# Patient Record
Sex: Female | Born: 1980 | Race: Black or African American | Hispanic: No | Marital: Married | Smoking: Current every day smoker
Health system: Southern US, Community
[De-identification: ages and names within clinical notes are randomized; demographics above are authoritative.]

---

## 2005-07-06 HISTORY — PX: ECTOPIC PREGNANCY SURGERY: SHX613

## 2006-09-14 ENCOUNTER — Emergency Department: Payer: Self-pay | Admitting: Emergency Medicine

## 2011-01-08 ENCOUNTER — Ambulatory Visit: Payer: Self-pay | Admitting: Family Medicine

## 2015-01-24 ENCOUNTER — Emergency Department
Admission: EM | Admit: 2015-01-24 | Discharge: 2015-01-24 | Disposition: A | Discharge status: Home / Self Care | Payer: Self-pay | Attending: Student | Admitting: Student

## 2015-01-24 ENCOUNTER — Encounter: Payer: Self-pay | Admitting: Emergency Medicine

## 2015-01-24 DIAGNOSIS — Z720 Tobacco use: Secondary | ICD-10-CM | POA: Insufficient documentation

## 2015-01-24 DIAGNOSIS — L720 Epidermal cyst: Secondary | ICD-10-CM | POA: Insufficient documentation

## 2015-01-24 MED ORDER — NAPROXEN 500 MG PO TABS: 500.0000 mg | ORAL_TABLET | Freq: Two times a day (BID) | ORAL | Status: AC

## 2015-01-24 MED ORDER — TRAMADOL HCL 50 MG PO TABS: 50.0000 mg | ORAL_TABLET | Freq: Four times a day (QID) | ORAL | Status: DC | PRN

## 2015-01-24 MED ORDER — SULFAMETHOXAZOLE-TRIMETHOPRIM 800-160 MG PO TABS: 1.0000 | ORAL_TABLET | Freq: Two times a day (BID) | ORAL | Status: DC

## 2015-01-24 NOTE — ED Provider Notes (Signed)
[facility] Emergency Department Provider Note ____________________________________________  Time seen: Approximately 7:33 AM  I have reviewed the triage vital signs and the nursing notes.   HISTORY  Chief Complaint Abscess   HPI Sandra Blevins is a 34 y.o. female who presents to the emergency department for a painful bump on her back. She reports it has been there for many years, but has never become painful. She denies fever or nausea.  History reviewed. No pertinent past medical history.  There are no active problems to display for this patient.   Past Surgical History  Procedure Laterality Date  . Ectopic pregnancy surgery  2007    Current Outpatient Rx  Name  Route  Sig  Dispense  Refill  . naproxen (NAPROSYN) 500 MG tablet   Oral   Take 1 tablet (500 mg total) by mouth 2 (two) times daily with a meal.   60 tablet   2   . sulfamethoxazole-trimethoprim (BACTRIM DS,SEPTRA DS) 800-160 MG per tablet   Oral   Take 1 tablet by mouth 2 (two) times daily.   20 tablet   0   . traMADol (ULTRAM) 50 MG tablet   Oral   Take 1 tablet (50 mg total) by mouth every 6 (six) hours as needed.   9 tablet   0     Allergies Review of patient's allergies indicates no known allergies.  No family history on file.  Social History History  Substance Use Topics  . Smoking status: Current Every Day Smoker -- 0.50 packs/day    Types: Cigarettes  . Smokeless tobacco: Not on file  . Alcohol Use: Not on file    Review of Systems   Constitutional: No fever/chills Eyes: No visual changes. ENT: No congestion or rhinorrhea Cardiovascular: Denies chest pain. Respiratory: Denies shortness of breath. Gastrointestinal: No abdominal pain.  No nausea, no vomiting.  No diarrhea.  No constipation. Genitourinary: Negative for dysuria. Musculoskeletal: Negative for back pain. Skin: Painful bump on mid back Neurological: Negative for headaches, focal weakness or  numbness.  10-point ROS otherwise negative.  ____________________________________________   PHYSICAL EXAM:  VITAL SIGNS: ED Triage Vitals  Enc Vitals Group     BP 01/24/15 0714 117/69 mmHg     Pulse Rate 01/24/15 0714 80     Resp 01/24/15 0714 18     Temp 01/24/15 0714 98.2 F (36.8 C)     Temp Source 01/24/15 0714 Oral     SpO2 01/24/15 0714 100 %     Weight 01/24/15 0714 160 lb (72.576 kg)     Height 01/24/15 0714 5\' 5"  (1.651 m)     Head Cir --      Peak Flow --      Pain Score 01/24/15 0714 7     Pain Loc --      Pain Edu? --      Excl. in GC? --     Constitutional: Alert and oriented. Well appearing and in no acute distress. Eyes: Conjunctivae are normal. PERRL. EOMI. Head: Atraumatic. Nose: No congestion/rhinnorhea. Mouth/Throat: Mucous membranes are moist.  Oropharynx non-erythematous. No oral lesions. Neck: No stridor. Cardiovascular: Normal rate, regular rhythm.  Good peripheral circulation. Respiratory: Normal respiratory effort.  No retractions. Lungs CTAB. Gastrointestinal: Soft and nontender. No distention. No abdominal bruits.  Musculoskeletal: No lower extremity tenderness nor edema.  No joint effusions. Neurologic:  Normal speech and language. No gross focal neurologic deficits are appreciated. Speech is normal. No gait instability. Skin: <1cm epidermal  cyst noted to midback. No fluctuance. No surrounding cellulitis.  Psychiatric: Mood and affect are normal. Speech and behavior are normal.  ____________________________________________   LABS (all labs ordered are listed, but only abnormal results are displayed)  Labs Reviewed - No data to display ____________________________________________  EKG   ____________________________________________  RADIOLOGY   ____________________________________________   PROCEDURES  Procedure(s) performed: None ____________________________________________   INITIAL IMPRESSION / ASSESSMENT AND PLAN / ED  COURSE  Pertinent labs & imaging results that were available during my care of the patient were reviewed by me and considered in my medical decision making (see chart for details).  I&D not indicated. Will give Bactrim and have her return if symptoms change or worsen. ____________________________________________   FINAL CLINICAL IMPRESSION(S) / ED DIAGNOSES  Final diagnoses:  Epidermal cyst       Chinita Pester, FNP 01/24/15 6962  Gayla Doss, MD 01/24/15 1527

## 2015-01-24 NOTE — Discharge Instructions (Signed)
Epidermal Cyst An epidermal cyst is sometimes called a sebaceous cyst, epidermal inclusion cyst, or infundibular cyst. These cysts usually contain a substance that looks "pasty" or "cheesy" and may have a bad smell. This substance is a protein called keratin. Epidermal cysts are usually found on the face, neck, or trunk. They may also occur in the vaginal area or other parts of the genitalia of both men and women. Epidermal cysts are usually small, painless, slow-growing bumps or lumps that move freely under the skin. It is important not to try to pop them. This may cause an infection and lead to tenderness and swelling. CAUSES  Epidermal cysts may be caused by a deep penetrating injury to the skin or a plugged hair follicle, often associated with acne. SYMPTOMS  Epidermal cysts can become inflamed and cause:  Redness.  Tenderness.  Increased temperature of the skin over the bumps or lumps.  Grayish-white, bad smelling material that drains from the bump or lump. DIAGNOSIS  Epidermal cysts are easily diagnosed by your caregiver during an exam. Rarely, a tissue sample (biopsy) may be taken to rule out other conditions that may resemble epidermal cysts. TREATMENT   Epidermal cysts often get better and disappear on their own. They are rarely ever cancerous.  If a cyst becomes infected, it may become inflamed and tender. This may require opening and draining the cyst. Treatment with antibiotics may be necessary. When the infection is gone, the cyst may be removed with minor surgery.  Small, inflamed cysts can often be treated with antibiotics or by injecting steroid medicines.  Sometimes, epidermal cysts become large and bothersome. If this happens, surgical removal in your caregiver's office may be necessary. HOME CARE INSTRUCTIONS  Only take over-the-counter or prescription medicines as directed by your caregiver.  Take your antibiotics as directed. Finish them even if you start to feel  better. SEEK MEDICAL CARE IF:   Your cyst becomes tender, red, or swollen.  Your condition is not improving or is getting worse.  You have any other questions or concerns. MAKE SURE YOU:  Understand these instructions.  Will watch your condition.  Will get help right away if you are not doing well or get worse. Document Released: 05/23/2004 Document Revised: 09/14/2011 Document Reviewed: 12/29/2010 ExitCare Patient Information 2015 [employer]. This information is not intended to replace advice given to you by your health care provider. Make sure you discuss any questions you have with your health care provider.  

## 2015-01-24 NOTE — ED Notes (Signed)
Patient presents to the ED with a painful raised area to her back.  Patient states she has had a "bump" on her back for ten years but it has never bothered her but Wednesday it started to be painful and slightly swollen.  Patient's husband "squeezed" area and greenish/brown discharge came out per patient.  Patient states area remains very tender and painful.

## 2015-01-24 NOTE — ED Notes (Signed)
Small abcess mid back

## 2017-11-12 ENCOUNTER — Encounter: Payer: Self-pay | Admitting: Emergency Medicine

## 2017-11-12 ENCOUNTER — Emergency Department
Admission: EM | Admit: 2017-11-12 | Discharge: 2017-11-14 | Disposition: A | Discharge status: Home / Self Care | Payer: 59 | Attending: Emergency Medicine | Admitting: Emergency Medicine

## 2017-11-12 DIAGNOSIS — F329 Major depressive disorder, single episode, unspecified: Secondary | ICD-10-CM | POA: Diagnosis not present

## 2017-11-12 DIAGNOSIS — F17210 Nicotine dependence, cigarettes, uncomplicated: Secondary | ICD-10-CM | POA: Diagnosis not present

## 2017-11-12 DIAGNOSIS — F32A Depression, unspecified: Secondary | ICD-10-CM

## 2017-11-12 DIAGNOSIS — R45851 Suicidal ideations: Secondary | ICD-10-CM | POA: Diagnosis not present

## 2017-11-12 DIAGNOSIS — F99 Mental disorder, not otherwise specified: Secondary | ICD-10-CM | POA: Diagnosis present

## 2017-11-12 LAB — CBC
HCT: 36 % (ref 35.0–47.0)
Hemoglobin: 12.3 g/dL (ref 12.0–16.0)
MCH: 30.5 pg (ref 26.0–34.0)
MCHC: 34.1 g/dL (ref 32.0–36.0)
MCV: 89.4 fL (ref 80.0–100.0)
PLATELETS: 318 10*3/uL (ref 150–440)
RBC: 4.03 MIL/uL (ref 3.80–5.20)
RDW: 15 % — AB (ref 11.5–14.5)
WBC: 4.1 10*3/uL (ref 3.6–11.0)

## 2017-11-12 LAB — COMPREHENSIVE METABOLIC PANEL
ALT: 14 U/L (ref 14–54)
AST: 21 U/L (ref 15–41)
Albumin: 4.8 g/dL (ref 3.5–5.0)
Alkaline Phosphatase: 47 U/L (ref 38–126)
Anion gap: 6 (ref 5–15)
BUN: 9 mg/dL (ref 6–20)
CALCIUM: 9.4 mg/dL (ref 8.9–10.3)
CHLORIDE: 106 mmol/L (ref 101–111)
CO2: 26 mmol/L (ref 22–32)
CREATININE: 0.63 mg/dL (ref 0.44–1.00)
GFR calc Af Amer: >60 mL/min (ref >60)
GFR calc non Af Amer: >60 mL/min (ref >60)
Glucose, Bld: 99 mg/dL (ref 65–99)
Potassium: 3.8 mmol/L (ref 3.5–5.1)
Sodium: 138 mmol/L (ref 135–145)
Total Bilirubin: 1.4 mg/dL — ABNORMAL HIGH (ref 0.3–1.2)
Total Protein: 8.9 g/dL — ABNORMAL HIGH (ref 6.5–8.1)

## 2017-11-12 LAB — URINE DRUG SCREEN, QUALITATIVE (ARMC ONLY)
Amphetamines, Ur Screen: NOT DETECTED
BARBITURATES, UR SCREEN: NOT DETECTED
BENZODIAZEPINE, UR SCRN: NOT DETECTED
CANNABINOID 50 NG, UR ~~LOC~~: POSITIVE — AB
Cocaine Metabolite,Ur ~~LOC~~: NOT DETECTED
MDMA (Ecstasy)Ur Screen: NOT DETECTED
Methadone Scn, Ur: NOT DETECTED
Opiate, Ur Screen: NOT DETECTED
Phencyclidine (PCP) Ur S: NOT DETECTED
TRICYCLIC, UR SCREEN: NOT DETECTED

## 2017-11-12 LAB — SALICYLATE LEVEL

## 2017-11-12 LAB — ETHANOL

## 2017-11-12 LAB — ACETAMINOPHEN LEVEL: Acetaminophen (Tylenol), Serum: <10 ug/mL — ABNORMAL LOW (ref 10–30)

## 2017-11-12 NOTE — ED Notes (Signed)
Patient called out for RN.  RN to bedside. Patient reported the process is taking too long and would like to be discharged. RN discussed process and expected wait times. Patient continued to express desire to leave. MD informed.

## 2017-11-12 NOTE — ED Notes (Signed)
This RN and Biochemist, clinical went to patient's bedside and discussed patient's desire to leave. Patient informed that she in under IVC. RN's discussed with patient the need to speak with counselor and psychiatry. Patient verbalized understanding of all information discussed. Patient calm and cooperative at this time.

## 2017-11-12 NOTE — ED Notes (Signed)
TTS counselor at bedside. 

## 2017-11-12 NOTE — BH Assessment (Signed)
Assessment Note  Sandra Blevins is an 37 y.o. female who presents to the ED via POV under the advisement of her husband. Pt states that she was out with her husband shopping and afterwards he brought her to the ED because the pt states, "He is concerned about my mental health".  She reports that "no real event" caused her to come to the ED or triggered her emotions she just "feels useless" and has overwhelming thoughts of sadness and depression. She reports that she isn't currently suicidal but has had several thoughts of suicide in the past. She states, "I never had a plan to kill myself. I just want to go home."  Pt reports that she lives in the home with her husband and three children (17, 14, 10).   During the assessment the pt was calm, cooperative, and seemed to have a desire to talk about her feelings truthfully. She does report depression symptoms including but not limited to overwhelming sadness, weight loss, feelings of worthlessness, and random crying spells that can last anywhere from 5-15 minutes in duration. Pt reports that she uses marijuana daily as a way to self-medicate her depression symptoms. She currently denes SI/HI A/V H/D at this time.    Diagnosis: Major Depressive Disorder.   Past Medical History: History reviewed. No pertinent past medical history.  Past Surgical History:  Procedure Laterality Date  . ECTOPIC PREGNANCY SURGERY  2007    Family History: No family history on file.  Social History:  reports that she has been smoking cigarettes.  She has been smoking about 0.50 packs per day. She does not have any smokeless tobacco history on file. Her alcohol and drug histories are not on file.  Additional Social History:  Alcohol / Drug Use Pain Medications: SEE MAR Prescriptions: SEE MAR Over the Counter: SEE MAR History of alcohol / drug use?: Yes Substance #1 Name of Substance 1: Marijuana 1 - Age of First Use: 15 1 - Frequency: Daily  CIWA: CIWA-Ar BP: (!)  149/84 Pulse Rate: (!) 56 COWS:    Allergies: No Known Allergies  Home Medications:  (Not in a hospital admission)  OB/GYN Status:  Patient's last menstrual period was 11/11/2017 (exact date).  General Assessment Data Location of Assessment: [facility] ED TTS Assessment: In system Is this a Tele or Face-to-Face Assessment?: Face-to-Face Is this an Initial Assessment or a Re-assessment for this encounter?: Initial Assessment Marital status: Married Is patient pregnant?: No Pregnancy Status: No Living Arrangements: Spouse/significant other, Children Can pt return to current living arrangement?: Yes Admission Status: Involuntary Is patient capable of signing voluntary admission?: No Referral Source: Self/Family/Friend Insurance type: AETNA  Medical Screening Exam [facility] Walk-in ONLY) Medical Exam completed: Yes  Crisis Care Plan Living Arrangements: Spouse/significant other, Children Legal Guardian: Other:(SELF) Name of Psychiatrist: N/A Name of Therapist: N/A  Education Status Is patient currently in school?: No Is the patient employed, unemployed or receiving disability?: Employed  Risk to self with the past 6 months Suicidal Ideation: No-Not Currently/Within Last 6 Months Has patient been a risk to self within the past 6 months prior to admission? : Yes Suicidal Intent: No-Not Currently/Within Last 6 Months Has patient had any suicidal intent within the past 6 months prior to admission? : Yes Is patient at risk for suicide?: Yes Suicidal Plan?: No-Not Currently/Within Last 6 Months Has patient had any suicidal plan within the past 6 months prior to admission? : Yes Access to Means: Yes Specify Access to Suicidal Means: Pt has gun  inside home.  What has been your use of drugs/alcohol within the last 12 months?: Pt reports marijuana use, daily Previous Attempts/Gestures: Yes How many times?: (SEVERAL) Other Self Harm Risks: N./A Triggers for Past Attempts: Other  (Comment)(Depression) Intentional Self Injurious Behavior: None Family Suicide History: No Recent stressful life event(s): Turmoil (Comment) Persecutory voices/beliefs?: No Depression: Yes Depression Symptoms: Tearfulness, Isolating, Loss of interest in usual pleasures, Feeling worthless/self pity Substance abuse history and/or treatment for substance abuse?: No Suicide prevention information given to non-admitted patients: Not applicable  Risk to Others within the past 6 months Homicidal Ideation: No Does patient have any lifetime risk of violence toward others beyond the six months prior to admission? : No Thoughts of Harm to Others: No Current Homicidal Intent: No Current Homicidal Plan: No Access to Homicidal Means: No Identified Victim: n/a History of harm to others?: No Assessment of Violence: None Noted Violent Behavior Description: none noted Does patient have access to weapons?: No Criminal Charges Pending?: No Does patient have a court date: No Is patient on probation?: No  Psychosis Hallucinations: None noted Delusions: None noted  Mental Status Report Appearance/Hygiene: Unremarkable Eye Contact: Good Motor Activity: Freedom of movement Speech: Logical/coherent Level of Consciousness: Alert Mood: Depressed, Irritable Affect: Appropriate to circumstance, Depressed Anxiety Level: Minimal Thought Processes: Coherent, Relevant Judgement: Unimpaired Orientation: Person, Time, Place, Situation, Appropriate for developmental age Obsessive Compulsive Thoughts/Behaviors: None  Cognitive Functioning Concentration: Decreased Memory: Recent Intact, Remote Intact Is patient IDD: No Is patient DD?: No Insight: Good Impulse Control: Good Appetite: Poor Have you had any weight changes? : Loss Amount of the weight change? (lbs): 15 lbs Sleep: No Change Total Hours of Sleep: 6 Vegetative Symptoms: Staying in bed  ADLScreening [facility] Assessment Services) Patient's  cognitive ability adequate to safely complete daily activities?: Yes Patient able to express need for assistance with ADLs?: Yes Independently performs ADLs?: Yes (appropriate for developmental age)  Prior Inpatient Therapy Prior Inpatient Therapy: No  Prior Outpatient Therapy Prior Outpatient Therapy: No Does patient have an ACCT team?: No Does patient have Intensive In-House Services?  : No Does patient have [facility] services? : No Does patient have P4CC services?: No  ADL Screening (condition at time of admission) Patient's cognitive ability adequate to safely complete daily activities?: Yes Is the patient deaf or have difficulty hearing?: No Does the patient have difficulty seeing, even when wearing glasses/contacts?: No Does the patient have difficulty concentrating, remembering, or making decisions?: No Patient able to express need for assistance with ADLs?: Yes Does the patient have difficulty dressing or bathing?: No Independently performs ADLs?: Yes (appropriate for developmental age) Does the patient have difficulty walking or climbing stairs?: No Weakness of Legs: None Weakness of Arms/Hands: None  Home Assistive Devices/Equipment Home Assistive Devices/Equipment: None  Therapy Consults (therapy consults require a physician order) PT Evaluation Needed: No OT Evalulation Needed: No SLP Evaluation Needed: No Abuse/Neglect Assessment (Assessment to be complete while patient is alone) Abuse/Neglect Assessment Can Be Completed: Yes Physical Abuse: Denies Verbal Abuse: Denies Sexual Abuse: Denies Exploitation of patient/patient's resources: Denies Self-Neglect: Denies Values / Beliefs Cultural Requests During Hospitalization: None Spiritual Requests During Hospitalization: None Consults Spiritual Care Consult Needed: No Social Work Consult Needed: No      Additional Information 1:1 In Past 12 Months?: No CIRT Risk: No Elopement Risk: No Does patient have  medical clearance?: Yes  Child/Adolescent Assessment Running Away Risk: (PT IS AN ADULT)  Disposition:  Disposition Initial Assessment Completed for this Encounter: Yes Disposition  of Patient: (PENDIND RECOMMENDATION) Patient refused recommended treatment: No Mode of transportation if patient is discharged?: Car  On Site Evaluation by:   Reviewed with Physician:    Takela Varden D Claressa Hughley 11/12/2017 10:51 PM

## 2017-11-12 NOTE — ED Provider Notes (Signed)
[facility] Emergency Department Provider Note ____________________________________________   First MD Initiated Contact with Patient 11/12/17 1524     (approximate)  I have reviewed the triage vital signs and the nursing notes.   HISTORY  Chief Complaint Depression and Suicidal    HPI Sandra Blevins is a 37 y.o. female with PMH as noted below who presents with depression, gradual onset for years, worsening recently, and associated with vague suicidal ideation.  Patient states that she does not have a specific plan, but has considered crashing her car or walking into traffic.  The symptoms have not been precipitated by any specific recent stressors.  History reviewed. No pertinent past medical history.  There are no active problems to display for this patient.   Past Surgical History:  Procedure Laterality Date  . ECTOPIC PREGNANCY SURGERY  2007    Prior to Admission medications   Medication Sig Start Date End Date Taking? Authorizing Provider  sulfamethoxazole-trimethoprim (BACTRIM DS,SEPTRA DS) 800-160 MG per tablet Take 1 tablet by mouth 2 (two) times daily. 01/24/15   Triplett, Rulon Eisenmenger B, FNP  traMADol (ULTRAM) 50 MG tablet Take 1 tablet (50 mg total) by mouth every 6 (six) hours as needed. 01/24/15   Chinita Pester, FNP    Allergies Patient has no known allergies.  No family history on file.  Social History Social History   Tobacco Use  . Smoking status: Current Every Day Smoker    Packs/day: 0.50    Types: Cigarettes  Substance Use Topics  . Alcohol use: Not on file  . Drug use: Not on file    Review of Systems  Constitutional: No fever. Eyes: No redness. ENT: No neck pain. Cardiovascular: Denies chest pain. Respiratory: Denies shortness of breath. Gastrointestinal: No vomiting.  Genitourinary: Negative for flank pain.  Musculoskeletal: Negative for back pain. Skin: Negative for rash. Neurological: Negative for  headache.   ____________________________________________   PHYSICAL EXAM:  VITAL SIGNS: ED Triage Vitals  Enc Vitals Group     BP 11/12/17 1238 (!) 149/84     Pulse Rate 11/12/17 1238 (!) 56     Resp 11/12/17 1238 20     Temp 11/12/17 1238 98.3 F (36.8 C)     Temp Source 11/12/17 1238 Oral     SpO2 11/12/17 1238 100 %     Weight 11/12/17 1239 149 lb (67.6 kg)     Height 11/12/17 1239  (1.651 m)     Head Circumference --      Peak Flow --      Pain Score 11/12/17 1238 0     Pain Loc --      Pain Edu? --      Excl. in GC? --     Constitutional: Alert and oriented. Well appearing and in no acute distress. Eyes: Conjunctivae are normal.  Head: Atraumatic. Nose: No congestion/rhinnorhea. Mouth/Throat: Mucous membranes are moist.   Neck: Normal range of motion.  Cardiovascular: Good peripheral circulation. Respiratory: Normal respiratory effort.  Gastrointestinal:  No distention.  Musculoskeletal: Extremities warm and well perfused.  Neurologic:  Normal speech and language. No gross focal neurologic deficits are appreciated.  Skin:  Skin is warm and dry. No rash noted. Psychiatric: Mood and affect are normal. Speech and behavior are normal.  ____________________________________________   LABS (all labs ordered are listed, but only abnormal results are displayed)  Labs Reviewed  COMPREHENSIVE METABOLIC PANEL - Abnormal; Notable for the following components:  Result Value   Total Protein 8.9 (*)    Total Bilirubin 1.4 (*)    All other components within normal limits  ACETAMINOPHEN LEVEL - Abnormal; Notable for the following components:   Acetaminophen (Tylenol), Serum <10 (*)    All other components within normal limits  CBC - Abnormal; Notable for the following components:   RDW 15.0 (*)    All other components within normal limits  URINE DRUG SCREEN, QUALITATIVE ([facility] ONLY) - Abnormal; Notable for the following components:   Cannabinoid 50 Ng, Ur Craig  POSITIVE (*)    All other components within normal limits  ETHANOL  SALICYLATE LEVEL  POC URINE PREG, ED   ____________________________________________  EKG   ____________________________________________  RADIOLOGY    ____________________________________________   PROCEDURES  Procedure(s) performed: No  Procedures  Critical Care performed: No ____________________________________________   INITIAL IMPRESSION / ASSESSMENT AND PLAN / ED COURSE  Pertinent labs & imaging results that were available during my care of the patient were reviewed by me and considered in my medical decision making (see chart for details).  37 year old female with PMH as noted above presents with depression and suicidal ideation.  She states she has never had a formal psychiatric diagnosis and has not been on medication previously.  She does not have an active plan to kill herself.  She has no acute medical complaints.  Vital signs are normal, and the patient's exam is unremarkable.  Plan: Labs for medical clearance, psych consult, and disposition per psych recommendations.    ----------------------------------------- 11:47 PM on 11/12/2017 -----------------------------------------  Lab work-up is unremarkable.  SOC recommends admission.  ____________________________________________   FINAL CLINICAL IMPRESSION(S) / ED DIAGNOSES  Final diagnoses:  Suicidal ideation      NEW MEDICATIONS STARTED DURING THIS VISIT:  New Prescriptions   No medications on file     Note:  This document was prepared using Dragon voice recognition software and may include unintentional dictation errors.    Dionne Bucy, MD 11/12/17 2348

## 2017-11-12 NOTE — ED Triage Notes (Signed)
Pt reports here because her husband is worried about her. Pt husband reports pt has been overly depressed and distant and has voiced thoughts of harming self. Pt admits to SI.

## 2017-11-12 NOTE — ED Notes (Signed)
Pt sad, tearful, states that she is depressed. Pt states that she has been depressed for years but it has gotten worse recently and she has had thoughts of hopelessness and thoughts of hurting herself. Pt contracts for safety at this time. Pt denies recent stressor.

## 2017-11-12 NOTE — ED Notes (Signed)
Patient is speaking with the [facility]  Dr. Yehuda Budd.

## 2017-11-12 NOTE — ED Notes (Signed)

## 2017-11-13 LAB — PREGNANCY, URINE: Preg Test, Ur: NEGATIVE

## 2017-11-13 MED ORDER — DIPHENHYDRAMINE HCL 25 MG PO CAPS
25.0000 mg | ORAL_CAPSULE | Freq: Once | ORAL | Status: AC
  Administered 2017-11-13: 25 mg via ORAL
  Filled 2017-11-13: qty 1

## 2017-11-13 MED ORDER — DIPHENHYDRAMINE HCL 25 MG PO CAPS: 50.0000 mg | ORAL_CAPSULE | Freq: Every evening | ORAL | Status: DC | PRN

## 2017-11-13 NOTE — ED Provider Notes (Signed)
-----------------------------------------   2:43 PM on 11/13/2017 -----------------------------------------   Blood pressure (!) 149/84, pulse (!) 56, temperature 98.3 F (36.8 C), temperature source Oral, resp. rate 20, height  (1.651 m), weight 67.6 kg (149 lb), last menstrual period 11/11/2017, SpO2 100 %.  The patient had no acute events since last update.  Calm and cooperative at this time.  Disposition is pending Psychiatry/Behavioral Medicine team recommendations.     Minna Antis, MD 11/13/17 [phone]

## 2017-11-13 NOTE — BH Assessment (Addendum)
Per AC-Tina, patient is under review at [facility].

## 2017-11-13 NOTE — ED Notes (Signed)
Pt out to the desk for a soda and right back to her room

## 2017-11-13 NOTE — ED Notes (Signed)
ED BHU PLACEMENT JUSTIFICATION Is the patient under IVC or is there intent for IVC: Yes.   Is the patient medically cleared: Yes.   Is there vacancy in the ED BHU: Yes.   Is the population mix appropriate for patient: Yes.   Is the patient awaiting placement in inpatient or outpatient setting: Yes.   Has the patient had a psychiatric consult: Yes.   Survey of unit performed for contraband, proper placement and condition of furniture, tampering with fixtures in bathroom, shower, and each patient room: Yes.   APPEARANCE/BEHAVIOR adequate rapport can be established NEURO ASSESSMENT Orientation: time, place and person Hallucinations: No.None noted (Hallucinations) Speech: Normal Gait: normal RESPIRATORY ASSESSMENT Normal expansion.  Clear to auscultation.  No rales, rhonchi, or wheezing. CARDIOVASCULAR ASSESSMENT regular rate and rhythm, S1, S2 normal, no murmur, click, rub or gallop GASTROINTESTINAL ASSESSMENT soft, nontender, BS WNL, no r/g EXTREMITIES normal strength, tone, and muscle mass PLAN OF CARE Provide calm/safe environment. Vital signs assessed twice daily. ED BHU Assessment once each 12-hour shift. Collaborate with intake RN daily or as condition indicates. Assure the ED provider has rounded once each shift. Provide and encourage hygiene. Provide redirection as needed. Assess for escalating behavior; address immediately and inform ED provider.  Assess family dynamic and appropriateness for visitation as needed: Yes.   Educate the patient/family about BHU procedures/visitation: Yes.   

## 2017-11-13 NOTE — BH Assessment (Signed)
This Clinical research associate spoke with patient's husband Sandra Blevins in [employer] and provided him a letter for work on behalf of patient. Patient gave permission to give work letter to husband. Husband expressed frustration due to wife not being able to leave and go home. Writer explained to husband patient is IVC'd due to expressing thoughts of self harm.
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
## 2017-11-13 NOTE — BH Assessment (Addendum)
Referrals sent to the following:  . [address], [place] [address]  [phone]  . [facility]   [address] [place] [place] [address]  [phone]  . [facility]   [address] [place] [address]  [phone]  . [facility]  [address] [place] [address]  [phone]  . [facility]   [address] [place] [address]  [phone]  . [facility]  [id number] [place], [place] [address]  [phone]

## 2017-11-13 NOTE — BH Assessment (Signed)
Per Annette @ [facility] no psych beds available.    

## 2017-11-13 NOTE — ED Notes (Signed)
Pt is asleep on the floor next to her bed.

## 2017-11-14 NOTE — ED Provider Notes (Signed)
-----------------------------------------   2:10 AM on 11/14/2017 -----------------------------------------   Blood pressure (!) 149/84, pulse (!) 56, temperature 98.3 F (36.8 C), temperature source Oral, resp. rate 20, height  (1.651 m), weight 67.6 kg (149 lb), last menstrual period 11/11/2017, SpO2 100 %.  The patient had no acute events since last update.  Calm and cooperative at this time.  Disposition is pending Psychiatry/Behavioral Medicine team recommendations.     Merrily Brittle, MD 11/14/17 [phone]

## 2017-11-14 NOTE — BH Assessment (Signed)
SOC recommends patient be d/c

## 2017-11-14 NOTE — ED Notes (Signed)
Lunch meal given. 

## 2017-11-14 NOTE — ED Notes (Signed)
Patient is alert and verbal. Patient is cooperative. Patient states her husband brought her in to the hospital. Patient did not wish to engage in any further conversation with this Clinical research associate. She denies SI/HI and A/V hallucinations. Patient Provided support and encouragement. Q 15 minute checks in progress and patient remains safe on unit.

## 2017-11-14 NOTE — Discharge Instructions (Addendum)
You have been seen in the emergency department for a  psychiatric concern. You have been evaluated both medically as well as psychiatrically. Please follow-up with your outpatient resources provided. Return to the emergency department for any worsening symptoms, or any thoughts of hurting yourself or anyone else so that we may attempt to help you. 

## 2017-11-14 NOTE — ED Notes (Signed)
Patient discharged home. Follow-up/Discharge instructions reviewed with patient and written copy given. Patient verbalized understanding. Patient given suicide prevention number on discharge papers. Patient left ambulatory and alert and oriented. Patient voiced no concerns and had no questions at this time. Vitals at discharge 98.0-124/81-81-18-100% room air. Patient escorted to lobby by staff.

## 2017-11-14 NOTE — ED Provider Notes (Signed)
-----------------------------------------   12:26 PM on 11/14/2017 -----------------------------------------  Patient has been seen and evaluated by specialist on-call psychiatrist.  They believe the patient is psychiatrically stable and safe for discharge home.  They have rescinded the IVC.  Patient's medical work-up is been largely nonrevealing.   Minna Antis, MD 11/14/17 1226

## 2017-11-14 NOTE — BH Assessment (Signed)
Repeat SOC has been ordered for possible d/c.

## 2018-02-11 ENCOUNTER — Encounter: Payer: Self-pay | Admitting: Advanced Practice Midwife

## 2018-02-25 ENCOUNTER — Encounter: Payer: Self-pay | Admitting: Obstetrics and Gynecology

## 2018-03-08 ENCOUNTER — Encounter: Payer: 59 | Admitting: Obstetrics and Gynecology

## 2018-08-08 ENCOUNTER — Other Ambulatory Visit: Payer: Self-pay | Admitting: Physician Assistant

## 2018-08-08 DIAGNOSIS — N644 Mastodynia: Secondary | ICD-10-CM

## 2021-02-03 ENCOUNTER — Other Ambulatory Visit: Payer: Self-pay

## 2021-02-03 ENCOUNTER — Emergency Department
Admission: EM | Admit: 2021-02-03 | Discharge: 2021-02-03 | Disposition: A | Discharge status: Home / Self Care | Payer: 59 | Attending: Emergency Medicine | Admitting: Emergency Medicine

## 2021-02-03 DIAGNOSIS — F17210 Nicotine dependence, cigarettes, uncomplicated: Secondary | ICD-10-CM | POA: Insufficient documentation

## 2021-02-03 DIAGNOSIS — K0889 Other specified disorders of teeth and supporting structures: Secondary | ICD-10-CM | POA: Insufficient documentation

## 2021-02-03 DIAGNOSIS — Z91040 Latex allergy status: Secondary | ICD-10-CM | POA: Insufficient documentation

## 2021-02-03 MED ORDER — HYDROCODONE-ACETAMINOPHEN 5-325 MG PO TABS
1.0000 | ORAL_TABLET | Freq: Once | ORAL | Status: AC
  Administered 2021-02-03: 1 via ORAL
  Filled 2021-02-03: qty 1

## 2021-02-03 MED ORDER — PENICILLIN V POTASSIUM 500 MG PO TABS
500.0000 mg | ORAL_TABLET | Freq: Once | ORAL | Status: AC
  Administered 2021-02-03: 500 mg via ORAL
  Filled 2021-02-03: qty 1

## 2021-02-03 MED ORDER — PENICILLIN V POTASSIUM 500 MG PO TABS: 500.0000 mg | ORAL_TABLET | Freq: Four times a day (QID) | ORAL | 0 refills | Status: DC

## 2021-02-03 MED ORDER — HYDROCODONE-ACETAMINOPHEN 5-325 MG PO TABS: 1.0000 | ORAL_TABLET | ORAL | 0 refills | Status: AC | PRN

## 2021-02-03 NOTE — ED Provider Notes (Signed)
[facility] Emergency Department Provider Note  Time seen: 11:08 AM  I have reviewed the triage vital signs and the nursing notes.   HISTORY  Chief Complaint Dental Pain   HPI Sandra Blevins is a 40 y.o. female with no significant past medical history presents to the emergency department for right upper dental pain.  According to the patient since yesterday she has been experiencing pain in the right upper molar which has now become severe.  No known fever.  Largely negative review of systems otherwise.  Patient has a Education officer, community but they are not open today, states she will follow-up tomorrow.   No past medical history on file.  There are no problems to display for this patient.   Past Surgical History:  Procedure Laterality Date   ECTOPIC PREGNANCY SURGERY  2007    Prior to Admission medications   Not on File    Allergies  Allergen Reactions   Latex Itching    No family history on file.  Social History Social History   Tobacco Use   Smoking status: Every Day    Packs/day: 0.50    Types: Cigarettes    Review of Systems Constitutional: Negative for fever. ENT: Right upper molar/dental pain. Cardiovascular: Negative for chest pain. Respiratory: Negative for shortness of breath. Gastrointestinal: Negative for abdominal pain Musculoskeletal: Negative for musculoskeletal complaints Neurological: Negative for headache All other ROS negative  ____________________________________________   PHYSICAL EXAM:  VITAL SIGNS: ED Triage Vitals  Enc Vitals Group     BP 02/03/21 0958 (!) 138/103     Pulse Rate 02/03/21 0958 85     Resp 02/03/21 0958 17     Temp 02/03/21 0958 98.7 F (37.1 C)     Temp Source 02/03/21 0958 Oral     SpO2 02/03/21 0958 99 %     Weight 02/03/21 1001 130 lb (59 kg)     Height 02/03/21 1001 5\' 5"  (1.651 m)     Head Circumference --      Peak Flow --      Pain Score 02/03/21 1001 10     Pain Loc --      Pain Edu? --       Excl. in GC? --    Constitutional: Alert and oriented. Well appearing and in no distress. Eyes: Normal exam ENT      Head: Normocephalic and atraumatic.  Tenderness over the right maxillary area.  No objective swelling/edema or erythema.      Mouth/Throat: Mucous membranes are moist.  Patient has an older appearing cavity of the right upper molar.  There is no gum tenderness or sign of abscess.  No drainage.  Patient able to open her mouth adequately, no trismus. Cardiovascular: Normal rate, regular rhythm. No murmurs, rubs, or gallops. Respiratory: Normal respiratory effort without tachypnea nor retractions. Breath sounds are clear Gastrointestinal: Soft and nontender. No distention.  Musculoskeletal: Nontender with normal range of motion in all extremities.  Neurologic:  Normal speech and language. No gross focal neurologic deficits Skin:  Skin is warm, dry and intact.  Psychiatric: Mood and affect are normal.   ____________________________________________   INITIAL IMPRESSION / ASSESSMENT AND PLAN / ED COURSE  Pertinent labs & imaging results that were available during my care of the patient were reviewed by me and considered in my medical decision making (see chart for details).   Patient presents to the emergency department for right upper dental pain started yesterday per patient.  Now severe.  No obvious signs of abscess or facial cellulitis.  Patient has a dentist but they are not open today and she will call first thing tomorrow morning.  We will place the patient on a short course of pain medication as well as 10 days of penicillin.  Patient agreeable to plan of care.  Provided by normal dental return precautions.  Concerning for possible developing dental infection but no signs of abscess.  Sandra Blevins was evaluated in Emergency Department on 02/03/2021 for the symptoms described in the history of present illness. She was evaluated in the context of the global COVID-19 pandemic,  which necessitated consideration that the patient might be at risk for infection with the SARS-CoV-2 virus that causes COVID-19. Institutional protocols and algorithms that pertain to the evaluation of patients at risk for COVID-19 are in a state of rapid change based on information released by regulatory bodies including the CDC and federal and state organizations. These policies and algorithms were followed during the patient's care in the ED.  ____________________________________________   FINAL CLINICAL IMPRESSION(S) / ED DIAGNOSES  Dental pain   Minna Antis, MD 02/03/21 1110

## 2021-02-03 NOTE — Discharge Instructions (Addendum)
Please follow-up with your dentist as soon as possible for recheck/reevaluation.  Return to the emergency department for any fever, worsening pain, swelling/redness of your face, or any other symptom personally concerning to yourself.

## 2021-02-03 NOTE — ED Triage Notes (Signed)
Pt here with right side dental pain. Pt states that she woke up Sat with severe pain from her teeth and gums on the right side of her mouth. Pt in triage moaning and crying.

## 2021-02-03 NOTE — ED Notes (Signed)
See triage note  Presents with possible dental abscess/pain   Pain is mainly to right side of face  Denies any trauma or fever

## 2021-05-09 ENCOUNTER — Encounter: Payer: Self-pay | Admitting: Emergency Medicine

## 2021-05-09 ENCOUNTER — Emergency Department
Admission: EM | Admit: 2021-05-09 | Discharge: 2021-05-09 | Disposition: A | Discharge status: Home / Self Care | Payer: Self-pay | Attending: Emergency Medicine | Admitting: Emergency Medicine

## 2021-05-09 ENCOUNTER — Other Ambulatory Visit: Payer: Self-pay

## 2021-05-09 ENCOUNTER — Emergency Department: Payer: Self-pay

## 2021-05-09 DIAGNOSIS — F410 Panic disorder [episodic paroxysmal anxiety] without agoraphobia: Secondary | ICD-10-CM | POA: Insufficient documentation

## 2021-05-09 DIAGNOSIS — R079 Chest pain, unspecified: Secondary | ICD-10-CM

## 2021-05-09 DIAGNOSIS — R0789 Other chest pain: Secondary | ICD-10-CM | POA: Insufficient documentation

## 2021-05-09 DIAGNOSIS — Z5321 Procedure and treatment not carried out due to patient leaving prior to being seen by health care provider: Secondary | ICD-10-CM | POA: Insufficient documentation

## 2021-05-09 DIAGNOSIS — R0602 Shortness of breath: Secondary | ICD-10-CM | POA: Insufficient documentation

## 2021-05-09 LAB — URINALYSIS, ROUTINE W REFLEX MICROSCOPIC
Bilirubin Urine: NEGATIVE
Glucose, UA: NEGATIVE mg/dL
Hgb urine dipstick: NEGATIVE
Ketones, ur: 5 mg/dL — AB
Nitrite: NEGATIVE
Protein, ur: NEGATIVE mg/dL
Specific Gravity, Urine: 1.03 (ref 1.005–1.030)
pH: 5 (ref 5.0–8.0)

## 2021-05-09 LAB — PREGNANCY, URINE: Preg Test, Ur: NEGATIVE

## 2021-05-09 LAB — COMPREHENSIVE METABOLIC PANEL
ALT: 14 U/L (ref 0–44)
AST: 23 U/L (ref 15–41)
Albumin: 4.4 g/dL (ref 3.5–5.0)
Alkaline Phosphatase: 36 U/L — ABNORMAL LOW (ref 38–126)
Anion gap: 9 (ref 5–15)
BUN: 16 mg/dL (ref 6–20)
CO2: 20 mmol/L — ABNORMAL LOW (ref 22–32)
Calcium: 9.4 mg/dL (ref 8.9–10.3)
Chloride: 107 mmol/L (ref 98–111)
Creatinine, Ser: 0.87 mg/dL (ref 0.44–1.00)
GFR, Estimated: >60 mL/min (ref >60)
Glucose, Bld: 125 mg/dL — ABNORMAL HIGH (ref 70–99)
Potassium: 3.6 mmol/L (ref 3.5–5.1)
Sodium: 136 mmol/L (ref 135–145)
Total Bilirubin: 0.9 mg/dL (ref 0.3–1.2)
Total Protein: 8.2 g/dL — ABNORMAL HIGH (ref 6.5–8.1)

## 2021-05-09 LAB — TROPONIN I (HIGH SENSITIVITY): Troponin I (High Sensitivity): <2 ng/L (ref <18)

## 2021-05-09 LAB — CBC WITH DIFFERENTIAL/PLATELET
Abs Immature Granulocytes: 0.02 10*3/uL (ref 0.00–0.07)
Basophils Absolute: 0 10*3/uL (ref 0.0–0.1)
Basophils Relative: 1 %
Eosinophils Absolute: 0.1 10*3/uL (ref 0.0–0.5)
Eosinophils Relative: 2 %
HCT: 33.4 % — ABNORMAL LOW (ref 36.0–46.0)
Hemoglobin: 11 g/dL — ABNORMAL LOW (ref 12.0–15.0)
Immature Granulocytes: 0 %
Lymphocytes Relative: 29 %
Lymphs Abs: 1.9 10*3/uL (ref 0.7–4.0)
MCH: 29.3 pg (ref 26.0–34.0)
MCHC: 32.9 g/dL (ref 30.0–36.0)
MCV: 89.1 fL (ref 80.0–100.0)
Monocytes Absolute: 0.4 10*3/uL (ref 0.1–1.0)
Monocytes Relative: 6 %
Neutro Abs: 4.1 10*3/uL (ref 1.7–7.7)
Neutrophils Relative %: 62 %
Platelets: 387 10*3/uL (ref 150–400)
RBC: 3.75 MIL/uL — ABNORMAL LOW (ref 3.87–5.11)
RDW: 15 % (ref 11.5–15.5)
WBC: 6.6 10*3/uL (ref 4.0–10.5)
nRBC: 0 % (ref 0.0–0.2)

## 2021-05-09 NOTE — ED Triage Notes (Signed)
Pt here with c/o having a panic attack at work, states she has had one recently, then began having midsternal cp and some shob, went home to help herself "calm down," however the cp and chest "heaviness" continued. NAD. No shob at this time.

## 2021-05-09 NOTE — ED Provider Notes (Signed)
Emergency Medicine Provider Triage Evaluation Note  Sandra Blevins , a 40 y.o. female  was evaluated in triage.  Pt complains of chest pain, anxiety/panic feeling that started while at work. Symptoms started. Around 8:30 am. History of the same a few months ago, but was able to calm herself down.   Review of Systems  Positive: Anxiety, chest pain Negative: Fever, vomiting.  Physical Exam  There were no vitals taken for this visit. Gen:   Awake, no distress   Resp:  Normal effort  MSK:   Moves extremities without difficulty  Other:    Medical Decision Making  Medically screening exam initiated at 2:45 PM.  Appropriate orders placed.  Ecko Beasley was informed that the remainder of the evaluation will be completed by another provider, this initial triage assessment does not replace that evaluation, and the importance of remaining in the ED until their evaluation is complete.   Chinita Pester, FNP 05/09/21 1449    Dionne Bucy, MD 05/09/21 Silva Bandy

## 2021-08-01 DIAGNOSIS — J3489 Other specified disorders of nose and nasal sinuses: Secondary | ICD-10-CM | POA: Diagnosis not present

## 2021-08-01 DIAGNOSIS — U071 COVID-19: Secondary | ICD-10-CM | POA: Diagnosis not present

## 2021-08-01 DIAGNOSIS — Z872 Personal history of diseases of the skin and subcutaneous tissue: Secondary | ICD-10-CM | POA: Diagnosis not present

## 2021-11-05 ENCOUNTER — Encounter: Payer: Self-pay | Admitting: Nurse Practitioner

## 2021-11-05 ENCOUNTER — Ambulatory Visit (INDEPENDENT_AMBULATORY_CARE_PROVIDER_SITE_OTHER): Payer: BC Managed Care – PPO | Admitting: Nurse Practitioner

## 2021-11-05 VITALS — BP 117/79 | HR 91 | Temp 97.8°F | Ht 64.0 in | Wt 143.0 lb

## 2021-11-05 DIAGNOSIS — Z0000 Encounter for general adult medical examination without abnormal findings: Secondary | ICD-10-CM

## 2021-11-05 DIAGNOSIS — Z720 Tobacco use: Secondary | ICD-10-CM | POA: Diagnosis not present

## 2021-11-05 DIAGNOSIS — N920 Excessive and frequent menstruation with regular cycle: Secondary | ICD-10-CM

## 2021-11-05 NOTE — Patient Instructions (Signed)
You were seen today in the [facility] to establish care and for referral. Labs were collected, results will be available via MyChart or, if abnormal, you will be contacted by clinic staff. You were prescribed medications, please take as directed. Please follow up in 6 mths for reevaluation of symptoms.  ?

## 2021-11-05 NOTE — Progress Notes (Signed)
? ?[facility] ?[place], [place]  [address] ?Phone:  [phone]   Fax:  [phone] ?Subjective:  ? Patient ID: Sandra Blevins, female    DOB: 1981/05/01, 41 y.o.   MRN: [id number] ? ?Chief Complaint  ?Patient presents with  ? Establish Care  ?  Patient is here today to establish care and to discuss her menstrual cycle last from 8 days to now 10 days. Patient would like a birth control method to help with that. Patient  ?Would like a referral to an ob/gyn as well.  ? ?HPI ?Sandra Blevins 41 y.o. female  has no past medical history on file. To the [facility] to establish care and for referral to Ob/gyn. Last visit with PCP unknown.  ? ?Patient states that she has history of long menstrual cycles and requesting she be referred for hysterectomy. Has history of ectopic pregnancy and tubal ligation. States that she has attempted birth control in the past to help with cycles, but they were not effective and she has trouble remembering to take pills regularly.  ? ?Currently works at [employer] in [employer]. States that she does not have a balanced diet and does not exercise regularly. Denies any other concerns today. Denies any fatigue, chest pain, shortness of breath, HA or dizziness. Denies any blurred vision, numbness or tingling. ? ?History reviewed. No pertinent past medical history. ? ?Past Surgical History:  ?Procedure Laterality Date  ? ECTOPIC PREGNANCY SURGERY  2007  ? ? ?Family History  ?Problem Relation Age of Onset  ? Hypertension Mother   ? Cancer Father   ? Diabetes Father   ? ? ?Social History  ? ?Socioeconomic History  ? Marital status: Married  ?  Spouse name: Not on file  ? Number of children: Not on file  ? Years of education: Not on file  ? Highest education level: Not on file  ?Occupational History  ? Not on file  ?Tobacco Use  ? Smoking status: Every Day  ?  Packs/day: 0.50  ?  Types: Cigarettes  ? Smokeless tobacco: Not on file  ?Vaping Use  ? Vaping Use: Never  used  ?Substance and Sexual Activity  ? Alcohol use: Not Currently  ? Drug use: Yes  ?  Types: Marijuana  ?  Comment: occ  ? Sexual activity: Yes  ?  Birth control/protection: None  ?Other Topics Concern  ? Not on file  ?Social History Narrative  ? Not on file  ? ?Social Determinants of Health  ? ?Financial Resource Strain: Not on file  ?Food Insecurity: Not on file  ?Transportation Needs: Not on file  ?Physical Activity: Not on file  ?Stress: Not on file  ?Social Connections: Not on file  ?Intimate Partner Violence: Not on file  ? ? ?Outpatient Medications Prior to Visit  ?Medication Sig Dispense Refill  ? HYDROcodone-acetaminophen (NORCO/VICODIN) 5-325 MG tablet Take 1 tablet by mouth every 4 (four) hours as needed for moderate pain. (Patient not taking: Reported on 11/05/2021) 15 tablet 0  ? penicillin v potassium (VEETID) 500 MG tablet Take 1 tablet (500 mg total) by mouth 4 (four) times daily. (Patient not taking: Reported on 11/05/2021) 40 tablet 0  ? ?No facility-administered medications prior to visit.  ? ? ?Allergies  ?Allergen Reactions  ? Latex Itching  ? ? ?Review of Systems  ?Constitutional: Negative.  Negative for chills, fever and malaise/fatigue.  ?HENT: Negative.    ?Eyes: Negative.   ?Respiratory:  Negative for  cough and shortness of breath.   ?Cardiovascular:  Negative for chest pain, palpitations and leg swelling.  ?Gastrointestinal:  Negative for abdominal pain, blood in stool, constipation, diarrhea, nausea and vomiting.  ?Genitourinary:  Negative for dysuria, flank pain, frequency, hematuria and urgency.  ?     See HPI  ?Musculoskeletal: Negative.   ?Skin: Negative.   ?Neurological: Negative.   ?Psychiatric/Behavioral:  Negative for depression. The patient is not nervous/anxious.   ?All other systems reviewed and are negative. ? ?   ?Objective:  ?  ?Physical Exam ?Vitals reviewed.  ?Constitutional:   ?   General: She is not in acute distress. ?   Appearance: Normal appearance. She is normal  weight.  ?HENT:  ?   Head: Normocephalic.  ?   Right Ear: Tympanic membrane, ear canal and external ear normal. There is no impacted cerumen.  ?   Left Ear: Tympanic membrane, ear canal and external ear normal. There is no impacted cerumen.  ?   Nose: Nose normal. No congestion or rhinorrhea.  ?   Mouth/Throat:  ?   Mouth: Mucous membranes are moist.  ?   Pharynx: Oropharynx is clear. No oropharyngeal exudate or posterior oropharyngeal erythema.  ?Eyes:  ?   General: No scleral icterus.    ?   Right eye: No discharge.     ?   Left eye: No discharge.  ?   Extraocular Movements: Extraocular movements intact.  ?   Conjunctiva/sclera: Conjunctivae normal.  ?   Pupils: Pupils are equal, round, and reactive to light.  ?Neck:  ?   Vascular: No carotid bruit.  ?Cardiovascular:  ?   Rate and Rhythm: Normal rate and regular rhythm.  ?   Pulses: Normal pulses.  ?   Heart sounds: Normal heart sounds.  ?   Comments: No obvious peripheral edema ?Pulmonary:  ?   Effort: Pulmonary effort is normal.  ?   Breath sounds: Normal breath sounds.  ?Abdominal:  ?   General: Abdomen is flat. Bowel sounds are normal. There is no distension.  ?   Palpations: Abdomen is soft. There is no mass.  ?   Tenderness: There is no abdominal tenderness. There is no right CVA tenderness, left CVA tenderness, guarding or rebound.  ?   Hernia: No hernia is present.  ?Musculoskeletal:     ?   General: No swelling, tenderness, deformity or signs of injury. Normal range of motion.  ?   Cervical back: Normal range of motion and neck supple. No rigidity or tenderness.  ?   Right lower leg: No edema.  ?   Left lower leg: No edema.  ?Lymphadenopathy:  ?   Cervical: No cervical adenopathy.  ?Skin: ?   General: Skin is warm and dry.  ?   Capillary Refill: Capillary refill takes less than 2 seconds.  ?Neurological:  ?   General: No focal deficit present.  ?   Mental Status: She is alert and oriented to person, place, and time.  ?Psychiatric:     ?   Mood and Affect:  Mood normal.     ?   Behavior: Behavior normal.     ?   Thought Content: Thought content normal.     ?   Judgment: Judgment normal.  ? ? ?BP 117/79   Pulse 91   Temp 97.8 ?F (36.6 ?C)   Ht 5' 4"  (1.626 m)   Wt 143 lb (64.9 kg)   SpO2 100%   BMI 24.55 kg/m?  ?  Wt Readings from Last 3 Encounters:  ?11/05/21 143 lb (64.9 kg)  ?02/03/21 130 lb (59 kg)  ?11/12/17 149 lb (67.6 kg)  ? ? ? ?There is no immunization history on file for this patient. ? ?Diabetic Foot Exam - Simple   ?No data filed ?  ? ? ?No results found for: TSH ?Lab Results  ?Component Value Date  ? WBC 7.0 11/05/2021  ? HGB 12.9 11/05/2021  ? HCT 39.5 11/05/2021  ? MCV 90 11/05/2021  ? PLT 389 11/05/2021  ? ?Lab Results  ?Component Value Date  ? NA 140 11/05/2021  ? K 4.1 11/05/2021  ? CO2 22 11/05/2021  ? GLUCOSE 88 11/05/2021  ? BUN 11 11/05/2021  ? CREATININE 0.89 11/05/2021  ? BILITOT 1.0 11/05/2021  ? ALKPHOS 49 11/05/2021  ? AST 23 11/05/2021  ? ALT 16 11/05/2021  ? PROT 8.6 (H) 11/05/2021  ? ALBUMIN 5.1 (H) 11/05/2021  ? CALCIUM 10.3 (H) 11/05/2021  ? ANIONGAP 9 05/09/2021  ? EGFR 84 11/05/2021  ? ?Lab Results  ?Component Value Date  ? CHOL 188 11/05/2021  ? ?Lab Results  ?Component Value Date  ? HDL 79 11/05/2021  ? ?Lab Results  ?Component Value Date  ? LDLCALC 100 (H) 11/05/2021  ? ?Lab Results  ?Component Value Date  ? TRIG 48 11/05/2021  ? ?Lab Results  ?Component Value Date  ? CHOLHDL 2.4 11/05/2021  ? ?No results found for: HGBA1C ? ?   ?Assessment & Plan:  ? ?Problem List Items Addressed This Visit   ?None ?Visit Diagnoses   ? ? Encounter for wellness examination in adult    -  Primary  ? Relevant Orders  ? CBC with Differential/Platelet (Completed)  ? Comprehensive metabolic panel (Completed)  ? Lipid panel (Completed) ?Discussed diet and exercise options  ? Menorrhagia with regular cycle      ? Relevant Orders  ? Ambulatory referral to Obstetrics / Gynecology  ? Tobacco abuse     ?Discussed smoking cessation options   ? ?Follow up in  6 mths for reevaluation of symptoms, sooner as needed  ? ? ?I am having Ravina Canterbury maintain her penicillin v potassium and HYDROcodone-acetaminophen. ? ?No orders of the defined types were placed in this encou

## 2021-11-06 LAB — LIPID PANEL
Chol/HDL Ratio: 2.4 ratio (ref 0.0–4.4)
Cholesterol, Total: 188 mg/dL (ref 100–199)
HDL: 79 mg/dL (ref >39)
LDL Chol Calc (NIH): 100 mg/dL — ABNORMAL HIGH (ref 0–99)
Triglycerides: 48 mg/dL (ref 0–149)
VLDL Cholesterol Cal: 9 mg/dL (ref 5–40)

## 2021-11-06 LAB — CBC WITH DIFFERENTIAL/PLATELET
Basophils Absolute: 0.1 10*3/uL (ref 0.0–0.2)
Basos: 1 %
EOS (ABSOLUTE): 0.2 10*3/uL (ref 0.0–0.4)
Eos: 3 %
Hematocrit: 39.5 % (ref 34.0–46.6)
Hemoglobin: 12.9 g/dL (ref 11.1–15.9)
Immature Grans (Abs): 0 10*3/uL (ref 0.0–0.1)
Immature Granulocytes: 0 %
Lymphocytes Absolute: 2 10*3/uL (ref 0.7–3.1)
Lymphs: 29 %
MCH: 29.4 pg (ref 26.6–33.0)
MCHC: 32.7 g/dL (ref 31.5–35.7)
MCV: 90 fL (ref 79–97)
Monocytes Absolute: 0.6 10*3/uL (ref 0.1–0.9)
Monocytes: 8 %
Neutrophils Absolute: 4.2 10*3/uL (ref 1.4–7.0)
Neutrophils: 59 %
Platelets: 389 10*3/uL (ref 150–450)
RBC: 4.39 x10E6/uL (ref 3.77–5.28)
RDW: 14.1 % (ref 11.7–15.4)
WBC: 7 10*3/uL (ref 3.4–10.8)

## 2021-11-06 LAB — COMPREHENSIVE METABOLIC PANEL
ALT: 16 IU/L (ref 0–32)
AST: 23 IU/L (ref 0–40)
Albumin/Globulin Ratio: 1.5 (ref 1.2–2.2)
Albumin: 5.1 g/dL — ABNORMAL HIGH (ref 3.8–4.8)
Alkaline Phosphatase: 49 IU/L (ref 44–121)
BUN/Creatinine Ratio: 12 (ref 9–23)
BUN: 11 mg/dL (ref 6–24)
Bilirubin Total: 1 mg/dL (ref 0.0–1.2)
CO2: 22 mmol/L (ref 20–29)
Calcium: 10.3 mg/dL — ABNORMAL HIGH (ref 8.7–10.2)
Chloride: 102 mmol/L (ref 96–106)
Creatinine, Ser: 0.89 mg/dL (ref 0.57–1.00)
Globulin, Total: 3.5 g/dL (ref 1.5–4.5)
Glucose: 88 mg/dL (ref 70–99)
Potassium: 4.1 mmol/L (ref 3.5–5.2)
Sodium: 140 mmol/L (ref 134–144)
Total Protein: 8.6 g/dL — ABNORMAL HIGH (ref 6.0–8.5)
eGFR: 84 mL/min/{1.73_m2} (ref >59)

## 2021-11-07 ENCOUNTER — Ambulatory Visit: Payer: Self-pay | Admitting: Nurse Practitioner

## 2021-12-02 DIAGNOSIS — J3489 Other specified disorders of nose and nasal sinuses: Secondary | ICD-10-CM | POA: Diagnosis not present

## 2021-12-02 DIAGNOSIS — N921 Excessive and frequent menstruation with irregular cycle: Secondary | ICD-10-CM | POA: Diagnosis not present

## 2021-12-08 ENCOUNTER — Other Ambulatory Visit: Payer: Self-pay

## 2021-12-08 ENCOUNTER — Emergency Department: Payer: BC Managed Care – PPO

## 2021-12-08 ENCOUNTER — Emergency Department
Admission: EM | Admit: 2021-12-08 | Discharge: 2021-12-08 | Disposition: A | Discharge status: Home / Self Care | Payer: BC Managed Care – PPO | Attending: Emergency Medicine | Admitting: Emergency Medicine

## 2021-12-08 DIAGNOSIS — S139XXA Sprain of joints and ligaments of unspecified parts of neck, initial encounter: Secondary | ICD-10-CM

## 2021-12-08 DIAGNOSIS — S0990XA Unspecified injury of head, initial encounter: Secondary | ICD-10-CM | POA: Insufficient documentation

## 2021-12-08 DIAGNOSIS — S134XXA Sprain of ligaments of cervical spine, initial encounter: Secondary | ICD-10-CM | POA: Insufficient documentation

## 2021-12-08 MED ORDER — IBUPROFEN 200 MG PO TABS: 400.0000 mg | ORAL_TABLET | Freq: Four times a day (QID) | ORAL | 2 refills | Status: AC | PRN
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
## 2022-05-13 ENCOUNTER — Ambulatory Visit: Payer: BC Managed Care – PPO | Admitting: Nurse Practitioner

## 2022-09-23 ENCOUNTER — Emergency Department
Admission: EM | Admit: 2022-09-23 | Discharge: 2022-09-23 | Disposition: A | Discharge status: Home / Self Care | Payer: Self-pay | Attending: Emergency Medicine | Admitting: Emergency Medicine

## 2022-09-23 ENCOUNTER — Other Ambulatory Visit: Payer: Self-pay

## 2022-09-23 DIAGNOSIS — N3090 Cystitis, unspecified without hematuria: Secondary | ICD-10-CM | POA: Insufficient documentation

## 2022-09-23 DIAGNOSIS — I10 Essential (primary) hypertension: Secondary | ICD-10-CM | POA: Insufficient documentation

## 2022-09-23 LAB — URINALYSIS, ROUTINE W REFLEX MICROSCOPIC
Bacteria, UA: NONE SEEN
Bilirubin Urine: NEGATIVE
Glucose, UA: NEGATIVE mg/dL
Ketones, ur: NEGATIVE mg/dL
Leukocytes,Ua: NEGATIVE
Nitrite: NEGATIVE
Protein, ur: NEGATIVE mg/dL
Specific Gravity, Urine: 1.004 — ABNORMAL LOW (ref 1.005–1.030)
pH: 6 (ref 5.0–8.0)

## 2022-09-23 LAB — HIV ANTIBODY (ROUTINE TESTING W REFLEX): HIV Screen 4th Generation wRfx: NONREACTIVE

## 2022-09-23 LAB — WET PREP, GENITAL
Clue Cells Wet Prep HPF POC: NONE SEEN
Sperm: NONE SEEN
Trich, Wet Prep: NONE SEEN
WBC, Wet Prep HPF POC: <10 (ref <10)
Yeast Wet Prep HPF POC: NONE SEEN

## 2022-09-23 LAB — CHLAMYDIA/NGC RT PCR (ARMC ONLY)
Chlamydia Tr: NOT DETECTED
N gonorrhoeae: NOT DETECTED

## 2022-09-23 LAB — POC URINE PREG, ED: Preg Test, Ur: NEGATIVE

## 2022-09-23 MED ORDER — FLUCONAZOLE 150 MG PO TABS
150.0000 mg | ORAL_TABLET | Freq: Once | ORAL | 0 refills | Status: AC
Start: 2022-09-23 — End: 2022-09-23

## 2022-09-23 MED ORDER — NITROFURANTOIN MONOHYD MACRO 100 MG PO CAPS: 100.0000 mg | ORAL_CAPSULE | Freq: Two times a day (BID) | ORAL | 0 refills | Status: AC

## 2022-09-23 NOTE — Discharge Instructions (Addendum)
Your STI testing is was negative so we are treating for UTI.  Please take the Macrobid twice a day for the next 5 days.

## 2022-09-23 NOTE — ED Provider Notes (Addendum)
[facility] Provider Note    Event Date/Time   First MD Initiated Contact with Patient 09/23/22 1519     (approximate)   History   UTI   HPI  Sandra Blevins is a 42 y.o. female with no stated past medical history presents with concern for UTI.  She notes that over the last day she has had some pressure with urination and feels that she cannot hold her urine is having urinary urgency.  Feels like urine is cloudy.  Also has some thick white discharge.  Denies abdominal pain or pelvic pain.  Denies pain with intercourse.  Denies fevers chills or flank pain.  No recent history of UTI.  No recent STDs.  She is sexually active monogamous     No past medical history on file.  There are no problems to display for this patient.    Physical Exam  Triage Vital Signs: ED Triage Vitals  Enc Vitals Group     BP 09/23/22 1258 (!) 155/90     Pulse Rate 09/23/22 1258 76     Resp 09/23/22 1258 18     Temp 09/23/22 1258 98.8 F (37.1 C)     Temp src --      SpO2 09/23/22 1258 100 %     Weight --      Height --      Head Circumference --      Peak Flow --      Pain Score 09/23/22 1256 0     Pain Loc --      Pain Edu? --      Excl. in [place]? --     Most recent vital signs: Vitals:   09/23/22 1258  BP: (!) 155/90  Pulse: 76  Resp: 18  Temp: 98.8 F (37.1 C)  SpO2: 100%     General: Awake, no distress.  CV:  Good peripheral perfusion.  Resp:  Normal effort.  Abd:  No distention.  Abdomen is soft and nontender Neuro:             Awake, Alert, Oriented x 3  Other:  Normal external GU exam no obvious discharge at the introitus no external lesions on the vulva   ED Results / Procedures / Treatments  Labs (all labs ordered are listed, but only abnormal results are displayed) Labs Reviewed  URINALYSIS, ROUTINE W REFLEX MICROSCOPIC - Abnormal; Notable for the following components:      Result Value   Color, Urine STRAW (*)    APPearance CLEAR (*)     Specific Gravity, Urine 1.004 (*)    Hgb urine dipstick SMALL (*)    All other components within normal limits  CHLAMYDIA/NGC RT PCR ([facility] ONLY)            WET PREP, GENITAL  HIV ANTIBODY (ROUTINE TESTING W REFLEX)  POC URINE PREG, ED     EKG     RADIOLOGY    PROCEDURES:  Critical Care performed: No  Procedures   MEDICATIONS ORDERED IN ED: Medications - No data to display   IMPRESSION / MDM / [place] / ED COURSE  I reviewed the triage vital signs and the nursing notes.                              Patient's presentation is most consistent with acute complicated illness / injury requiring diagnostic workup.  Differential diagnosis includes, but is  not limited to, cystitis, cervicitis, vaginitis, less likely PID pyelonephritis  Patient is a 42 year old female presents the day of urinary urgency cloudy urine and light vaginal discharge.  Denying abdominal pain pelvic pain flank pain fevers chills nausea vomiting or other signs of systemic illness.  Patient looks quite well her vital signs are notable for hypertension but are otherwise reassuring.  Abdominal exam is benign there is no tenderness in the lower quadrants or throughout.  I did perform an external GU exam there is no obvious discharge at the introitus no lesions.  Urinalysis shows 6-10 white cells which is not the most convincing for cystitis and given her discharge I am more suspicious of a cervicitis or vaginitis.  I did collect a wet prep swab during the external GU exam.  Will send to test for trichomoniasis BV and yeast.  If negative will treat as cystitis.  Patient did not want to wait for wet prep to have results to return to I did state that I would give her a phone call to call in prescription.  Wet prep is negative.  Given patient's urinary symptoms which are the most sensitive for UTI will treat for cystitis with 5 days of Macrobid.       FINAL CLINICAL IMPRESSION(S) / ED DIAGNOSES    Final diagnoses:  Cystitis     Rx / DC Orders   ED Discharge Orders          Ordered    nitrofurantoin, macrocrystal-monohydrate, (MACROBID) 100 MG capsule  2 times daily        09/23/22 1609             Note:  This document was prepared using Dragon voice recognition software and may include unintentional dictation errors.   Rada Hay, MD 09/23/22 1551    Rada Hay, MD 09/23/22 1630

## 2022-09-23 NOTE — ED Triage Notes (Signed)
Pt comes with c/o possible UTI. Pt states this all started last Thursday. Pt states cloudy urine no burning but she feels she can't empty bladder. Pt states white discharge. Pt states she wants to be checked for STI

## 2022-09-23 NOTE — ED Notes (Signed)
3 SST, green and lav all sent to lab
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
## 2023-06-18 ENCOUNTER — Ambulatory Visit: Payer: Self-pay

## 2023-06-24 ENCOUNTER — Ambulatory Visit: Payer: Self-pay

## 2023-06-25 ENCOUNTER — Encounter: Payer: Self-pay | Admitting: Emergency Medicine

## 2023-06-25 ENCOUNTER — Other Ambulatory Visit: Payer: Self-pay

## 2023-06-25 ENCOUNTER — Emergency Department
Admission: EM | Admit: 2023-06-25 | Discharge: 2023-06-25 | Disposition: A | Discharge status: Home / Self Care | Payer: Self-pay | Attending: Emergency Medicine | Admitting: Emergency Medicine

## 2023-06-25 DIAGNOSIS — B9689 Other specified bacterial agents as the cause of diseases classified elsewhere: Secondary | ICD-10-CM | POA: Insufficient documentation

## 2023-06-25 DIAGNOSIS — B3731 Acute candidiasis of vulva and vagina: Secondary | ICD-10-CM | POA: Insufficient documentation

## 2023-06-25 DIAGNOSIS — N760 Acute vaginitis: Secondary | ICD-10-CM | POA: Insufficient documentation

## 2023-06-25 LAB — CHLAMYDIA/NGC RT PCR (ARMC ONLY)
Chlamydia Tr: NOT DETECTED
N gonorrhoeae: NOT DETECTED

## 2023-06-25 LAB — POC URINE PREG, ED: Preg Test, Ur: NEGATIVE

## 2023-06-25 LAB — URINALYSIS, ROUTINE W REFLEX MICROSCOPIC
Bilirubin Urine: NEGATIVE
Glucose, UA: NEGATIVE mg/dL
Hgb urine dipstick: NEGATIVE
Ketones, ur: NEGATIVE mg/dL
Nitrite: NEGATIVE
Protein, ur: NEGATIVE mg/dL
Specific Gravity, Urine: 1.025 (ref 1.005–1.030)
pH: 5 (ref 5.0–8.0)

## 2023-06-25 LAB — WET PREP, GENITAL
Sperm: NONE SEEN
Trich, Wet Prep: NONE SEEN
WBC, Wet Prep HPF POC: <10 (ref <10)

## 2023-06-25 MED ORDER — FLUCONAZOLE 150 MG PO TABS: ORAL_TABLET | ORAL | 0 refills | Status: DC

## 2023-06-25 MED ORDER — METRONIDAZOLE 500 MG PO TABS: 500.0000 mg | ORAL_TABLET | Freq: Two times a day (BID) | ORAL | 0 refills | Status: AC

## 2023-06-25 NOTE — ED Provider Notes (Signed)
[facility] Provider Note    Event Date/Time   First MD Initiated Contact with Patient 06/25/23 [phone]     (approximate)   History   Urinary Tract Infection   HPI  Sandra Blevins is a 42 y.o. female with no significant past medical history presents emergency department with vaginal itching that she woke up with this morning.  Is concerned it might be a STI.  States the vaginal discharge is her normal discharge.  No odor.  No fever or chills      Physical Exam   Triage Vital Signs: ED Triage Vitals  Encounter Vitals Group     BP 06/25/23 0829 138/78     Systolic BP Percentile --      Diastolic BP Percentile --      Pulse Rate 06/25/23 0829 85     Resp 06/25/23 0829 17     Temp 06/25/23 0829 98.6 F (37 C)     Temp Source 06/25/23 0829 Oral     SpO2 06/25/23 0829 99 %     Weight 06/25/23 0831 139 lb 15.9 oz (63.5 kg)     Height 06/25/23 0831 5\' 5"  (1.651 m)     Head Circumference --      Peak Flow --      Pain Score 06/25/23 0830 0     Pain Loc --      Pain Education --      Exclude from Growth Chart --     Most recent vital signs: Vitals:   06/25/23 0829  BP: 138/78  Pulse: 85  Resp: 17  Temp: 98.6 F (37 C)  SpO2: 99%     General: Awake, no distress.   CV:  Good peripheral perfusion. regular rate and  rhythm Resp:  Normal effort.  Abd:  No distention.   Other:      ED Results / Procedures / Treatments   Labs (all labs ordered are listed, but only abnormal results are displayed) Labs Reviewed  WET PREP, GENITAL - Abnormal; Notable for the following components:      Result Value   Yeast Wet Prep HPF POC PRESENT (*)    Clue Cells Wet Prep HPF POC PRESENT (*)    All other components within normal limits  URINALYSIS, ROUTINE W REFLEX MICROSCOPIC - Abnormal; Notable for the following components:   Color, Urine YELLOW (*)    APPearance CLOUDY (*)    Leukocytes,Ua MODERATE (*)    Bacteria, UA RARE (*)    All other components  within normal limits  CHLAMYDIA/NGC RT PCR ([facility] ONLY)            POC URINE PREG, ED     EKG     RADIOLOGY     PROCEDURES:   Procedures   MEDICATIONS ORDERED IN ED: Medications - No data to display   IMPRESSION / MDM / ASSESSMENT AND PLAN / ED COURSE  I reviewed the triage vital signs and the nursing notes.                              Differential diagnosis includes, but is not limited to, chlamydia, gonorrhea, bacterial vaginosis, trichomoniasis, UTI, yeast  Patient's presentation is most consistent with acute illness / injury with system symptoms.   Wet prep is positive for clue cells and WBCs  gC/chlamydia pending  UA reassuring  Did discharge patient with BV and yeast, gave her prescription  for Flagyl and Diflucan.  I explained to her I would only call her with urinalysis and GC/chlamydia findings if positive.  She can also check her results on [place] MyChart.  GC/chlamydia test negative      FINAL CLINICAL IMPRESSION(S) / ED DIAGNOSES   Final diagnoses:  BV (bacterial vaginosis)  Vaginal yeast infection     Rx / DC Orders   ED Discharge Orders          Ordered    metroNIDAZOLE (FLAGYL) 500 MG tablet  2 times daily        06/25/23 0942    fluconazole (DIFLUCAN) 150 MG tablet        06/25/23 0944             Note:  This document was prepared using Dragon voice recognition software and may include unintentional dictation errors.    Faythe Ghee, PA-C 06/25/23 1220    Sharyn Creamer, MD 06/25/23 1539

## 2023-06-25 NOTE — ED Notes (Signed)
Pt states she cannot urinate at this time while in triage. Pt instructed to give urine sample to ED staff when she is able to urinate.

## 2023-06-25 NOTE — ED Triage Notes (Signed)
Pt here suspecting a UTI. Pt states she woke up with itching. Pt denies burning with urination. Pt denies NVD or fevers. Pt would also like to have STI testing while she is here.

## 2023-08-26 ENCOUNTER — Emergency Department
Admission: EM | Admit: 2023-08-26 | Discharge: 2023-08-26 | Discharge status: Against Medical Advice | Payer: No Typology Code available for payment source | Attending: Emergency Medicine | Admitting: Emergency Medicine

## 2023-08-26 DIAGNOSIS — Z5321 Procedure and treatment not carried out due to patient leaving prior to being seen by health care provider: Secondary | ICD-10-CM | POA: Diagnosis not present

## 2023-08-26 DIAGNOSIS — K1370 Unspecified lesions of oral mucosa: Secondary | ICD-10-CM | POA: Diagnosis present

## 2023-08-26 NOTE — ED Triage Notes (Signed)
 Patient reports she has a cold sore on her lip and under her tongue. Noticed the sores this morning. No other complaints.

## 2023-09-06 ENCOUNTER — Encounter: Payer: Self-pay | Admitting: Nurse Practitioner

## 2023-09-06 ENCOUNTER — Ambulatory Visit: Payer: Self-pay | Admitting: Nurse Practitioner

## 2023-09-06 DIAGNOSIS — Z113 Encounter for screening for infections with a predominantly sexual mode of transmission: Secondary | ICD-10-CM

## 2023-09-06 LAB — HM HIV SCREENING LAB: HM HIV Screening: NEGATIVE

## 2023-09-06 LAB — HM HEPATITIS C SCREENING LAB: HM Hepatitis Screen: NEGATIVE

## 2023-09-06 LAB — WET PREP FOR TRICH, YEAST, CLUE
Trichomonas Exam: NEGATIVE
Yeast Exam: NEGATIVE

## 2023-09-06 LAB — HEPATITIS B SURFACE ANTIGEN: Hepatitis B Surface Ag: NONREACTIVE

## 2023-09-06 NOTE — Progress Notes (Unsigned)
 Pt is here for STD screening. Condoms declined. Sonda Primes, RN.

## 2023-09-06 NOTE — Progress Notes (Signed)
 [facility] Department STI clinic [address] [place] [address] Main phone: [phone]  STI screening visit  Subjective:  Sandra Blevins is a 43 y.o. female being seen today for an STI screening visit. The patient reports they do have symptoms.  Patient reports that they do not desire a pregnancy in the next year.   They reported they are not interested in discussing contraception today.    Patient's last menstrual period was 08/21/2023.  Patient has the following medical conditions:  There are no active problems to display for this patient.   Chief Complaint  Patient presents with   SEXUALLY TRANSMITTED DISEASE    Pt is here for STD screening and has symptoms   Patient is a 43 y.o. female who presents to the office today requesting symptomatic STI testing. Her reported symptoms today include increased white, thick, non-odorous vaginal discharge with urine urgency, and a bump under her tongue. Patient denies other symptoms of possible UTI including no urine frequency or dysuria.  Patient indicates 1 female partner in the last 2 months. She reports practicing vaginal, oral, and anal sex and does not use condoms. Patient indicates a history of an STI but she reports not know what and not knowing when. Patient reports last sex was 1 day ago. She indicates an ectopic pregnancy that resulted in the removal of a fallopian tube followed by ligation of the other, so patient is surgically sterile.  Patient indicates LMP was 08/21/23.    Does the patient using douching products? No  Last HIV test per patient/review of record was No results found for: "HMHIVSCREEN"  Lab Results  Component Value Date   HIV Non Reactive 09/23/2022     Last HEPC test per patient/review of record was No results found for: "HMHEPCSCREEN" No components found for: "HEPC"   Last HEPB test per patient/review of record was No components found for: "HMHEPBSCREEN"   Patient reports  last pap was:   No results found for: "DIAGPAP", "HPVHIGH", "ADEQPAP" No results found for: "SPECADGYN" No Cervical Cancer Screening results to display.  Screening for MPX risk: Does the patient have an unexplained rash? No Is the patient MSM? No Does the patient endorse multiple sex partners or anonymous sex partners? No Did the patient have close or sexual contact with a person diagnosed with MPX? No Has the patient traveled outside the [place] where MPX is endemic? No Is there a high clinical suspicion for MPX-- evidenced by one of the following No  -Unlikely to be chickenpox  -Lymphadenopathy  -Rash that present in same phase of evolution on any given body part See flowsheet for further details and programmatic requirements.   Immunization history:   There is no immunization history on file for this patient.   The following portions of the patient's history were reviewed and updated as appropriate: allergies, current medications, past medical history, past social history, past surgical history and problem list.  Objective:  There were no vitals filed for this visit.  Physical Exam Nursing note reviewed. Chaperone present: Declined chaperone.  Constitutional:      Appearance: Normal appearance.  HENT:     Head: Normocephalic.     Salivary Glands: Right salivary gland is not diffusely enlarged or tender. Left salivary gland is not diffusely enlarged or tender.     Mouth/Throat:     Lips: Pink. No lesions.     Mouth: Mucous membranes are moist.     Tongue: Lesions present. Tongue does  not deviate from midline.     Pharynx: Oropharynx is clear. Uvula midline.     Tonsils: No tonsillar exudate.     Comments: Ulcerated lesion present to underside of patient tongue. Too small to measure. Appeared clear fluid filled. Patient reported not painful.   Eyes:     General:        Right eye: No discharge.        Left eye: No discharge.  Pulmonary:     Effort: Pulmonary effort is normal.   Genitourinary:    General: Normal vulva.     Exam position: Lithotomy position.     Pubic Area: No rash or pubic lice.      Tanner stage (genital): 5.     Labia:        Right: No rash, tenderness, lesion or injury.        Left: No rash, tenderness, lesion or injury.      Vagina: Vaginal discharge present. No erythema, tenderness, bleeding or lesions.     Cervix: Normal. No cervical motion tenderness, discharge, friability, lesion, erythema, cervical bleeding or eversion.     Uterus: Normal.      Adnexa: Right adnexa normal and left adnexa normal.       Comments: pH<4.5 Minimal amount of thick, white, non-odorous d/c present within vaginal canal that is non-adherent.   Non-inflamed, not bleeding hemorrhoids present to external anal region.  Lymphadenopathy:     Head:     Right side of head: No submental, submandibular, tonsillar, preauricular or posterior auricular adenopathy.     Left side of head: No submental, submandibular, tonsillar, preauricular or posterior auricular adenopathy.     Cervical: No cervical adenopathy.     Right cervical: No superficial or posterior cervical adenopathy.    Left cervical: No superficial or posterior cervical adenopathy.     Upper Body:     Right upper body: No supraclavicular or axillary adenopathy.     Left upper body: No supraclavicular or axillary adenopathy.     Lower Body: No right inguinal adenopathy. No left inguinal adenopathy.  Skin:    General: Skin is warm and dry.     Findings: No rash.     Comments: Skin tone appropriate for ethnicity.   Neurological:     Mental Status: She is alert and oriented to person, place, and time.  Psychiatric:        Attention and Perception: Attention and perception normal.        Mood and Affect: Mood and affect normal.        Speech: Speech normal.        Behavior: Behavior normal. Behavior is cooperative.        Thought Content: Thought content normal.     Assessment and Plan:  Sandra Blevins  is a 43 y.o. female presenting to the [facility] Department for STI screening  1. Screening for venereal disease (Primary) Wet Prep negative in office today.  Encouraged patient to see PCP, UC, or ED for urinary symptoms if they continue or she begins having additional  symptoms associated with possible UTI. Patient verbalized understanding and agreed with plan.   - Chlamydia/Gonorrhea [place] [facility] - HBV Antigen/Antibody [facility] - HIV/HCV [place] [facility] - Syphilis Serology, [place] [facility] - Gonococcus culture - WET PREP FOR TRICH, YEAST, CLUE - Gonococcus culture  Patient accepted all screenings including oral, vaginal CT/GC and bloodwork for HIV/RPR, and wet prep. Patient meets criteria for HepB  screening? Yes. Ordered? yes Patient meets criteria for HepC screening? Yes. Ordered? yes  Treat wet prep per standing order Discussed time line for State Lab results and that patient will be called with positive results and encouraged patient to call if she had not heard in 2 weeks.  Counseled to return or seek care for continued or worsening symptoms Recommended repeat testing in 3 months with positive results. Recommended condom use with all sex for STI prevention.   Patient is currently using Postpartum Sterilization, Sterilization by Laparoscopy, Sterilization for Men and Women to prevent pregnancy.    Return if symptoms worsen or fail to improve.  No future appointments.  Total time with patient 30 minutes.   Edmonia James, NP

## 2023-09-07 ENCOUNTER — Encounter: Payer: Self-pay | Admitting: Nurse Practitioner

## 2023-09-11 LAB — GONOCOCCUS CULTURE

## 2024-08-02 ENCOUNTER — Encounter: Payer: Self-pay | Admitting: Certified Nurse Midwife

## 2024-08-03 ENCOUNTER — Ambulatory Visit: Admitting: Certified Nurse Midwife

## 2024-08-03 ENCOUNTER — Encounter: Payer: Self-pay | Admitting: Certified Nurse Midwife

## 2024-08-03 ENCOUNTER — Other Ambulatory Visit (HOSPITAL_COMMUNITY)
Admission: RE | Admit: 2024-08-03 | Discharge: 2024-08-03 | Disposition: A | Source: Ambulatory Visit | Attending: Certified Nurse Midwife | Admitting: Certified Nurse Midwife

## 2024-08-03 VITALS — BP 129/86 | HR 64 | Ht 65.0 in | Wt 155.6 lb

## 2024-08-03 DIAGNOSIS — Z3042 Encounter for surveillance of injectable contraceptive: Secondary | ICD-10-CM

## 2024-08-03 DIAGNOSIS — Z1329 Encounter for screening for other suspected endocrine disorder: Secondary | ICD-10-CM

## 2024-08-03 DIAGNOSIS — Z124 Encounter for screening for malignant neoplasm of cervix: Secondary | ICD-10-CM

## 2024-08-03 DIAGNOSIS — Z1331 Encounter for screening for depression: Secondary | ICD-10-CM

## 2024-08-03 DIAGNOSIS — Z01419 Encounter for gynecological examination (general) (routine) without abnormal findings: Secondary | ICD-10-CM | POA: Diagnosis present

## 2024-08-03 DIAGNOSIS — Z131 Encounter for screening for diabetes mellitus: Secondary | ICD-10-CM

## 2024-08-03 DIAGNOSIS — F17200 Nicotine dependence, unspecified, uncomplicated: Secondary | ICD-10-CM

## 2024-08-03 DIAGNOSIS — Z1151 Encounter for screening for human papillomavirus (HPV): Secondary | ICD-10-CM

## 2024-08-03 DIAGNOSIS — N926 Irregular menstruation, unspecified: Secondary | ICD-10-CM

## 2024-08-03 DIAGNOSIS — Z113 Encounter for screening for infections with a predominantly sexual mode of transmission: Secondary | ICD-10-CM | POA: Diagnosis present

## 2024-08-03 DIAGNOSIS — Z1231 Encounter for screening mammogram for malignant neoplasm of breast: Secondary | ICD-10-CM

## 2024-08-03 DIAGNOSIS — Z87891 Personal history of nicotine dependence: Secondary | ICD-10-CM

## 2024-08-03 DIAGNOSIS — Z114 Encounter for screening for human immunodeficiency virus [HIV]: Secondary | ICD-10-CM

## 2024-08-03 MED ORDER — MEDROXYPROGESTERONE ACETATE 150 MG/ML IM SUSP
150.0000 mg | Freq: Once | INTRAMUSCULAR | Status: AC
  Administered 2024-08-03: 150 mg via INTRAMUSCULAR

## 2024-08-03 NOTE — Patient Instructions (Addendum)
 Health Maintenance, Female Adopting a healthy lifestyle and getting preventive care are important in promoting health and wellness. Ask your health care provider about: The right schedule for you to have regular tests and exams. Things you can do on your own to prevent diseases and keep yourself healthy. What should I know about diet, weight, and exercise? Eat a healthy diet  Eat a diet that includes plenty of vegetables, fruits, low-fat dairy products, and lean protein. Do not eat a lot of foods that are high in solid fats, added sugars, or sodium. Maintain a healthy weight Body mass index (BMI) is used to identify weight problems. It estimates body fat based on height and weight. Your health care provider can help determine your BMI and help you achieve or maintain a healthy weight. Get regular exercise Get regular exercise. This is one of the most important things you can do for your health. Most adults should: Exercise for at least 150 minutes each week. The exercise should increase your heart rate and make you sweat (moderate-intensity exercise). Do strengthening exercises at least twice a week. This is in addition to the moderate-intensity exercise. Spend less time sitting. Even light physical activity can be beneficial. Watch cholesterol and blood lipids Have your blood tested for lipids and cholesterol at 44 years of age, then have this test every 5 years. Have your cholesterol levels checked more often if: Your lipid or cholesterol levels are high. You are older than 44 years of age. You are at high risk for heart disease. What should I know about cancer screening? Depending on your health history and family history, you may need to have cancer screening at various ages. This may include screening for: Breast cancer. Cervical cancer. Colorectal cancer. Skin cancer. Lung cancer. What should I know about heart disease, diabetes, and high blood pressure? Blood pressure and heart  disease High blood pressure causes heart disease and increases the risk of stroke. This is more likely to develop in people who have high blood pressure readings or are overweight. Have your blood pressure checked: Every 3-5 years if you are 52-29 years of age. Every year if you are 25 years old or older. Diabetes Have regular diabetes screenings. This checks your fasting blood sugar level. Have the screening done: Once every three years after age 56 if you are at a normal weight and have a low risk for diabetes. More often and at a younger age if you are overweight or have a high risk for diabetes. What should I know about preventing infection? Hepatitis B If you have a higher risk for hepatitis B, you should be screened for this virus. Talk with your health care provider to find out if you are at risk for hepatitis B infection. Hepatitis C Testing is recommended for: Everyone born from [birth year] through 1965. Anyone with known risk factors for hepatitis C. Sexually transmitted infections (STIs) Get screened for STIs, including gonorrhea and chlamydia, if: You are sexually active and are younger than 44 years of age. You are older than 44 years of age and your health care provider tells you that you are at risk for this type of infection. Your sexual activity has changed since you were last screened, and you are at increased risk for chlamydia or gonorrhea. Ask your health care provider if you are at risk. Ask your health care provider about whether you are at high risk for HIV. Your health care provider may recommend a prescription medicine to help prevent HIV  infection. If you choose to take medicine to prevent HIV, you should first get tested for HIV. You should then be tested every 3 months for as long as you are taking the medicine. Pregnancy If you are about to stop having your period (premenopausal) and you may become pregnant, seek counseling before you get pregnant. Take 400 to 800  micrograms (mcg) of folic acid every day if you become pregnant. Ask for birth control (contraception) if you want to prevent pregnancy. Osteoporosis and menopause Osteoporosis is a disease in which the bones lose minerals and strength with aging. This can result in bone fractures. If you are 64 years old or older, or if you are at risk for osteoporosis and fractures, ask your health care provider if you should: Be screened for bone loss. Take a calcium or vitamin D  supplement to lower your risk of fractures. Be given hormone replacement therapy (HRT) to treat symptoms of menopause. Follow these instructions at home: Alcohol use Do not drink alcohol if: Your health care provider tells you not to drink. You are pregnant, may be pregnant, or are planning to become pregnant. If you drink alcohol: Limit how much you have to: 0-1 drink a day. Know how much alcohol is in your drink. In the [place] one drink equals one 12 oz bottle of beer (355 mL), one 5 oz glass of wine (148 mL), or one 1 oz glass of hard liquor (44 mL). Lifestyle Do not use any products that contain nicotine  or tobacco. These products include cigarettes, chewing tobacco, and vaping devices, such as e-cigarettes. If you need help quitting, ask your health care provider. Do not use street drugs. Do not share needles. Ask your health care provider for help if you need support or information about quitting drugs. General instructions Schedule regular health, dental, and eye exams. Stay current with your vaccines. Tell your health care provider if: You often feel depressed. You have ever been abused or do not feel safe at home. Summary Adopting a healthy lifestyle and getting preventive care are important in promoting health and wellness. Follow your health care provider's instructions about healthy diet, exercising, and getting tested or screened for diseases. Follow your health care provider's instructions on monitoring your  cholesterol and blood pressure. This information is not intended to replace advice given to you by your health care provider. Make sure you discuss any questions you have with your health care provider. Document Revised: 11/11/2020 Document Reviewed: 11/11/2020 Elsevier Patient Education  2024 Elsevier Inc. Insert smoking cessation patient instructions here.

## 2024-08-03 NOTE — Progress Notes (Signed)
 "   Passmore, Christain FERNS, NP (Inactive)   Chief Complaint  Patient presents with   Establish Care   Annual Exam    HPI:      Sandra Blevins is a 44 y.o. [phone] whose LMP was Patient's last menstrual period was 07/10/2024 (exact date)., presents today for annual exam, establishment of care. She reports multiple concerns: irregular & heavy menstrual bleeding with mood changes, denies suicidality but reports she does not have anything she looks forward to. Reports her mind is never quiet. Also memory loss, brain fog. Tried Lexapro in the past but had no improvement, has been hospitalized for psychiatric illness once but unsure of date.  She denies history of mania, does endorse history of impulsivity as well as one psychiatric hospitalization. Reports a procedure on her cervix, unsure what burning or freezing when she was in [place] . Smoking 1/2ppd, would like to cut down/stop MJ daily to help with anxiety No regular exercise.  Lives with her son Safe in relationships Works full time at [employer]  Period Duration (Days): 8 Period Pattern: (!) Irregular Menstrual Flow: Heavy Menstrual Control: Thin pad, Maxi pad Menstrual Control Change Freq (Hours): 1.5-3 Dysmenorrhea: (!) Severe (The first 3 days) Dysmenorrhea Symptoms: Diarrhea, Cramping  There are no active problems to display for this patient.   Past Surgical History:  Procedure Laterality Date   ECTOPIC PREGNANCY SURGERY  2007    Family History  Problem Relation Age of Onset   Hypertension Mother    Alzheimer's disease Mother    Diabetes Father     Social History   Socioeconomic History   Marital status: Married    Spouse name: Not on file   Number of children: 1   Years of education: Not on file   Highest education level: High school graduate  Occupational History   Not on file  Tobacco Use   Smoking status: Every Day    Current packs/day: 0.50    Types: Cigarettes   Smokeless tobacco: Not on file   Vaping Use   Vaping status: Never Used  Substance and Sexual Activity   Alcohol use: Not Currently   Drug use: Yes    Types: Marijuana    Comment: occ   Sexual activity: Yes    Partners: Male    Birth control/protection: Surgical    Comment: Patient with hx of ectopic pregnancy with fallopian tube removal. Other fallopian tube ligated.  Other Topics Concern   Not on file  Social History Narrative   Not on file   Social Drivers of Health   Tobacco Use: High Risk (08/03/2024)   Patient History    Smoking Tobacco Use: Every Day    Smokeless Tobacco Use: Unknown    Passive Exposure: Not on file  Financial Resource Strain: Not on file  Food Insecurity: Not on file  Transportation Needs: Not on file  Physical Activity: Not on file  Stress: Not on file  Social Connections: Not on file  Intimate Partner Violence: Not on file  Depression (PHQ2-9): High Risk (08/03/2024)   Depression (PHQ2-9)    PHQ-2 Score: 20  Alcohol Screen: Not on file  Housing: Not on file  Utilities: Not on file  Health Literacy: Not on file    Outpatient Medications Prior to Visit  Medication Sig Dispense Refill   fluconazole  (DIFLUCAN ) 150 MG tablet Take one now and one in a week (Patient not taking: Reported on 08/03/2024) 2 tablet 0   No facility-administered medications prior to visit.  ROS:  Review of Systems  Constitutional:  Positive for activity change and fatigue.  Respiratory: Negative.    Cardiovascular: Negative.   Genitourinary:  Positive for menstrual problem.  Psychiatric/Behavioral:  Positive for dysphoric mood. The patient is nervous/anxious.      OBJECTIVE:   Vitals:  BP 129/86   Pulse 64   Ht 5' 5 (1.651 m)   Wt 155 lb 9.6 oz (70.6 kg)   LMP 07/10/2024 (Exact Date)   BMI 25.89 kg/m   Physical Exam Vitals reviewed.  Constitutional:      General: She is not in acute distress.    Appearance: Normal appearance. She is well-developed and well-groomed. She is not  ill-appearing.  HENT:     Head: Normocephalic.  Neck:     Thyroid: No thyroid mass or thyromegaly.  Cardiovascular:     Rate and Rhythm: Normal rate and regular rhythm.  Pulmonary:     Effort: Pulmonary effort is normal.     Breath sounds: Normal breath sounds.  Chest:  Breasts:    Tanner Score is 5.     Right: No inverted nipple, mass or tenderness.     Left: No inverted nipple, mass or tenderness.  Abdominal:     General: Abdomen is flat.     Palpations: Abdomen is soft.     Tenderness: There is no abdominal tenderness.  Genitourinary:    General: Normal vulva.     Vagina: Normal.     Cervix: Friability present. No cervical motion tenderness.     Uterus: Not enlarged and not tender.      Adnexa:        Right: No mass or tenderness.         Left: No mass or tenderness.       Rectum: External hemorrhoid present.  Musculoskeletal:     Cervical back: No tenderness.  Lymphadenopathy:     Upper Body:     Right upper body: No supraclavicular or axillary adenopathy.     Left upper body: No supraclavicular or axillary adenopathy.  Skin:    General: Skin is warm and dry.  Neurological:     General: No focal deficit present.     Mental Status: She is alert and oriented to person, place, and time.  Psychiatric:        Attention and Perception: Attention and perception normal.        Mood and Affect: Mood is anxious. Affect is tearful.        Speech: Speech normal.        Behavior: Behavior normal. Behavior is cooperative.        Thought Content: Thought content does not include suicidal ideation. Thought content does not include suicidal plan.     Results: No results found for this or any previous visit (from the past 24 hours).   Assessment/Plan: 1. Well woman exam (Primary) - Cytology - PAP - MM 3D SCREENING MAMMOGRAM BILATERAL BREAST; Future - CBC With Diff/Platelet - Lipid panel - Hemoglobin A1c - TSH Rfx on Abnormal to Free T4 - Ferritin - RPR W/RFLX TO RPR  TITER, TREPONEMAL AB, SCREEN AND DIAGNOSIS - HIV Antibody (routine testing w rflx) - Hepatitis C antibody - Hepatitis B surface antigen - VITAMIN D  25 Hydroxy (Vit-D Deficiency, Fractures) - Comprehensive metabolic panel with GFR - Cervicovaginal ancillary only  2. Encounter for screening for human papillomavirus (HPV) - Cytology - PAP  3. Cervical cancer screening - Cytology - PAP  4. Breast cancer  screening by mammogram - MM 3D SCREENING MAMMOGRAM BILATERAL BREAST; Future  5. Encounter for management and injection of depo-Provera  - medroxyPROGESTERone  (DEPO-PROVERA ) injection 150 mg  6. Screening for diabetes mellitus - Hemoglobin A1c  7. Screening for thyroid disorder - TSH Rfx on Abnormal to Free T4  8. Screen for sexually transmitted diseases - RPR W/RFLX TO RPR TITER, TREPONEMAL AB, SCREEN AND DIAGNOSIS - Hepatitis C antibody - Hepatitis B surface antigen - Cervicovaginal ancillary only  9. Screening for human immunodeficiency virus - HIV Antibody (routine testing w rflx)  10. Positive depression screening - Ambulatory referral to Behavioral Health  11. Personal history of tobacco use, presenting hazards to health  12. Tobacco dependency - nicotine  polacrilex (NICORETTE ) 2 MG gum; Take 1 each (2 mg total) by mouth as needed for smoking cessation.  Dispense: 110 each; Refill: 1  Discussed with [facility] uncontrolled anxiety may contribute to some of her symptoms, however given her report of not tolerating lexapro in the past and possibility of manic type symptoms. Referral to Behavioral health to start counseling & possible medication management Depo today for menorrhagia, reviewed this may take 3-4m for regulation. Smoking cessation: nicotine  gum prescribed  Follow up in one month for bleeding. Labs pending.    No orders of the defined types were placed in this encounter.    Harlene LITTIE Cisco, CNM 08/03/2024 10:06 AM       "

## 2024-08-04 ENCOUNTER — Encounter: Payer: Self-pay | Admitting: Certified Nurse Midwife

## 2024-08-04 DIAGNOSIS — Z1331 Encounter for screening for depression: Secondary | ICD-10-CM | POA: Insufficient documentation

## 2024-08-04 DIAGNOSIS — N926 Irregular menstruation, unspecified: Secondary | ICD-10-CM | POA: Insufficient documentation

## 2024-08-04 LAB — CBC WITH DIFF/PLATELET
Basophils Absolute: 0.1 10*3/uL (ref 0.0–0.2)
Basos: 1 %
EOS (ABSOLUTE): 0.1 10*3/uL (ref 0.0–0.4)
Eos: 2 %
Hematocrit: 36.5 % (ref 34.0–46.6)
Hemoglobin: 11.1 g/dL (ref 11.1–15.9)
Immature Grans (Abs): 0 10*3/uL (ref 0.0–0.1)
Immature Granulocytes: 0 %
Lymphocytes Absolute: 1.3 10*3/uL (ref 0.7–3.1)
Lymphs: 20 %
MCH: 26.2 pg — ABNORMAL LOW (ref 26.6–33.0)
MCHC: 30.4 g/dL — ABNORMAL LOW (ref 31.5–35.7)
MCV: 86 fL (ref 79–97)
Monocytes Absolute: 0.4 10*3/uL (ref 0.1–0.9)
Monocytes: 6 %
Neutrophils Absolute: 4.6 10*3/uL (ref 1.4–7.0)
Neutrophils: 71 %
Platelets: 388 10*3/uL (ref 150–450)
RBC: 4.24 x10E6/uL (ref 3.77–5.28)
RDW: 16.8 % — ABNORMAL HIGH (ref 11.7–15.4)
WBC: 6.4 10*3/uL (ref 3.4–10.8)

## 2024-08-04 LAB — SYPHILIS: RPR W/REFLEX TO RPR TITER AND TREPONEMAL ANTIBODIES, TRADITIONAL SCREENING AND DIAGNOSIS ALGORITHM: RPR Ser Ql: NONREACTIVE

## 2024-08-04 LAB — CERVICOVAGINAL ANCILLARY ONLY
Chlamydia: NEGATIVE
Comment: NEGATIVE
Comment: NEGATIVE
Comment: NORMAL
Neisseria Gonorrhea: NEGATIVE
Trichomonas: NEGATIVE

## 2024-08-04 LAB — HEPATITIS B SURFACE ANTIGEN: Hepatitis B Surface Ag: NEGATIVE

## 2024-08-04 LAB — VITAMIN D 25 HYDROXY (VIT D DEFICIENCY, FRACTURES): Vit D, 25-Hydroxy: 8.7 ng/mL — ABNORMAL LOW (ref 30.0–100.0)

## 2024-08-04 LAB — HEMOGLOBIN A1C
Est. average glucose Bld gHb Est-mCnc: 117 mg/dL
Hgb A1c MFr Bld: 5.7 % — ABNORMAL HIGH (ref 4.8–5.6)

## 2024-08-04 LAB — LIPID PANEL
Chol/HDL Ratio: 2.8 ratio (ref 0.0–4.4)
Cholesterol, Total: 191 mg/dL (ref 100–199)
HDL: 68 mg/dL
LDL Chol Calc (NIH): 114 mg/dL — ABNORMAL HIGH (ref 0–99)
Triglycerides: 44 mg/dL (ref 0–149)
VLDL Cholesterol Cal: 9 mg/dL (ref 5–40)

## 2024-08-04 LAB — COMPREHENSIVE METABOLIC PANEL WITH GFR
ALT: 12 [IU]/L (ref 0–32)
AST: 17 [IU]/L (ref 0–40)
Albumin: 4.8 g/dL (ref 3.9–4.9)
Alkaline Phosphatase: 50 [IU]/L (ref 41–116)
BUN/Creatinine Ratio: 18 (ref 9–23)
BUN: 14 mg/dL (ref 6–24)
Bilirubin Total: 0.4 mg/dL (ref 0.0–1.2)
CO2: 18 mmol/L — ABNORMAL LOW (ref 20–29)
Calcium: 9.5 mg/dL (ref 8.7–10.2)
Chloride: 108 mmol/L — ABNORMAL HIGH (ref 96–106)
Creatinine, Ser: 0.79 mg/dL (ref 0.57–1.00)
Globulin, Total: 3.1 g/dL (ref 1.5–4.5)
Glucose: 95 mg/dL (ref 70–99)
Potassium: 4.6 mmol/L (ref 3.5–5.2)
Sodium: 143 mmol/L (ref 134–144)
Total Protein: 7.9 g/dL (ref 6.0–8.5)
eGFR: 95 mL/min/{1.73_m2}

## 2024-08-04 LAB — TSH RFX ON ABNORMAL TO FREE T4: TSH: 2.13 u[IU]/mL (ref 0.450–4.500)

## 2024-08-04 LAB — FERRITIN: Ferritin: 9 ng/mL — ABNORMAL LOW (ref 15–150)

## 2024-08-04 LAB — HIV ANTIBODY (ROUTINE TESTING W REFLEX): HIV Screen 4th Generation wRfx: NONREACTIVE

## 2024-08-04 LAB — HEPATITIS C ANTIBODY: Hep C Virus Ab: NONREACTIVE

## 2024-08-04 MED ORDER — NICOTINE POLACRILEX 2 MG MT GUM: 2.0000 mg | CHEWING_GUM | OROMUCOSAL | 1 refills | Status: AC | PRN

## 2024-08-05 ENCOUNTER — Other Ambulatory Visit: Payer: Self-pay | Admitting: Certified Nurse Midwife

## 2024-08-05 ENCOUNTER — Ambulatory Visit: Payer: Self-pay | Admitting: Certified Nurse Midwife

## 2024-08-05 DIAGNOSIS — R7303 Prediabetes: Secondary | ICD-10-CM

## 2024-08-05 MED ORDER — FERROUS SULFATE 325 (65 FE) MG PO TABS: 325.0000 mg | ORAL_TABLET | ORAL | 3 refills | Status: AC

## 2024-08-05 MED ORDER — VITAMIN D (ERGOCALCIFEROL) 1.25 MG (50000 UNIT) PO CAPS: 50000.0000 [IU] | ORAL_CAPSULE | ORAL | 0 refills | Status: AC

## 2024-08-07 ENCOUNTER — Telehealth: Admitting: Family Medicine

## 2024-08-07 DIAGNOSIS — B379 Candidiasis, unspecified: Secondary | ICD-10-CM | POA: Diagnosis not present

## 2024-08-07 DIAGNOSIS — R3989 Other symptoms and signs involving the genitourinary system: Secondary | ICD-10-CM

## 2024-08-07 DIAGNOSIS — T3695XA Adverse effect of unspecified systemic antibiotic, initial encounter: Secondary | ICD-10-CM | POA: Diagnosis not present

## 2024-08-07 DIAGNOSIS — L0100 Impetigo, unspecified: Secondary | ICD-10-CM | POA: Diagnosis not present

## 2024-08-07 MED ORDER — MUPIROCIN 2 % EX OINT: 1.0000 | TOPICAL_OINTMENT | Freq: Two times a day (BID) | CUTANEOUS | 0 refills | Status: AC

## 2024-08-07 MED ORDER — NITROFURANTOIN MONOHYD MACRO 100 MG PO CAPS: 100.0000 mg | ORAL_CAPSULE | Freq: Two times a day (BID) | ORAL | 0 refills | Status: AC

## 2024-08-07 MED ORDER — FLUCONAZOLE 150 MG PO TABS: 150.0000 mg | ORAL_TABLET | ORAL | 0 refills | Status: AC

## 2024-08-07 NOTE — Patient Instructions (Addendum)
 " Sandra Blevins, thank you for joining Chiquita CHRISTELLA Barefoot, NP for today's virtual visit.  While this provider is not your primary care provider (PCP), if your PCP is located in our provider database this encounter information will be shared with them immediately following your visit.   A [facility] MyChart account gives you access to today's visit and all your visits, tests, and labs performed at [facility]  click here if you don't have a [facility] MyChart account or go to [email or web address]  Consent: (Patient) Sandra Blevins provided verbal consent for this virtual visit at the beginning of the encounter.  Current Medications:  Current Outpatient Medications:    fluconazole  (DIFLUCAN ) 150 MG tablet, Take 1 tablet (150 mg total) by mouth as directed. Repeat in 3 days as needed, Disp: 2 tablet, Rfl: 0   mupirocin  ointment (BACTROBAN ) 2 %, Apply 1 Application topically 2 (two) times daily., Disp: 22 g, Rfl: 0   nitrofurantoin , macrocrystal-monohydrate, (MACROBID ) 100 MG capsule, Take 1 capsule (100 mg total) by mouth 2 (two) times daily for 5 days., Disp: 10 capsule, Rfl: 0   ferrous sulfate  325 (65 FE) MG tablet, Take 1 tablet (325 mg total) by mouth every other day., Disp: 60 tablet, Rfl: 3   nicotine  polacrilex (NICORETTE ) 2 MG gum, Take 1 each (2 mg total) by mouth as needed for smoking cessation., Disp: 110 each, Rfl: 1   Vitamin D , Ergocalciferol , (DRISDOL ) 1.25 MG (50000 UNIT) CAPS capsule, Take 1 capsule (50,000 Units total) by mouth every 7 (seven) days for 6 doses., Disp: 6 capsule, Rfl: 0   Medications ordered in this encounter:  Meds ordered this encounter  Medications   mupirocin  ointment (BACTROBAN ) 2 %    Sig: Apply 1 Application topically 2 (two) times daily.    Dispense:  22 g    Refill:  0    Supervising Provider:   BLAISE ALEENE KIDD [[id number]]   nitrofurantoin , macrocrystal-monohydrate, (MACROBID ) 100 MG capsule    Sig: Take 1 capsule (100 mg total) by mouth 2  (two) times daily for 5 days.    Dispense:  10 capsule    Refill:  0    Supervising Provider:   BLAISE ALEENE KIDD [[id number]]   fluconazole  (DIFLUCAN ) 150 MG tablet    Sig: Take 1 tablet (150 mg total) by mouth as directed. Repeat in 3 days as needed    Dispense:  2 tablet    Refill:  0    Supervising Provider:   LAMPTEY, PHILIP O [[id number]]     *If you need refills on other medications prior to your next appointment, please contact your pharmacy*  Follow-Up: Call back or seek an in-person evaluation if the symptoms worsen or if the condition fails to improve as anticipated.     Other Instructions Skin Infection (Impetigo) in Adults: What to Know Impetigo is an infection of the skin. It commonly occurs in young children, but it can also occur in adults. There are two types of impetigo: Bullous. Nonbullous. Ecthyma is a skin problem that is like impetigo but more serious. It is usually caused by the same bacteria. Ecthyma causes deep sores on the skin that can leave scars. Sometimes, people call it deep impetigo. Impetigo usually goes away in 7-10 days with treatment. What are the causes? This condition is caused by two types of germs (bacteria). Nonbullous impetigo can be caused by staphylococci or streptococci. Bullous impetigo is caused by staphylococci. These bacteria cause impetigo when they get under the  surface of the skin. Impetigo is contagious, which means it spreads easily from person to person. It may be spread through close skin contact or by sharing towels, clothing, or other items that an infected person has touched. Scratching the affected area can cause impetigo to spread to other parts of the body. The bacteria can get under your fingernails and spread when you touch another area of your skin. What increases the risk? The following factors may make you more likely to get this condition: Playing sports that include skin-to-skin contact with others. Having broken skin,  such as from a cut, scrape, insect bite, or rash. Living in an area that has high humidity levels. Having poor hygiene. Having high levels of staphylococci in your nose. Having a condition that weakens the skin, such as: Having a weak body defense system (immune system). Having a skin condition with open sores, such as chickenpox. Having diabetes. What are the signs or symptoms? Impetigo causes itchy blisters and sores. Your symptoms will depend on the type of impetigo you have. In some cases, the blisters can cause itching or burning. Bullous impetigo You might get big blisters that break open and leak yellow fluid. These blisters usually appear on your belly, arms, legs, under your arms, or in your groin area. Nonbullous impetigo You might get small blisters that break open and leak, making a yellow crust. These blisters are often found on your face, arms, or legs. Ecthyma You might have deep sores that break open and leak, forming a black or brown crust. How is this diagnosed? Impetigo may be diagnosed with an exam. Your health care provider will look at the sores. In some cases, a swab of the fluid from the sores may be taken. How is this treated? Treatment for this condition depends on how bad your symptoms are Mild impetigo can be treated with prescription antibiotic cream. Oral antibiotic medicine may be used in really bad cases. Medicines that help with itching (antihistamines)may also be used. Follow these instructions at home: Medicines Take medicines only as told. If you were given antibiotic cream or ointment, use it for the time you were told. Do not stop using it sooner even if you start to feel better. Before applying antibiotic cream or ointment, you should: Gently wash the infected areas with antibacterial soap and warm water. Soak crusted areas in warm, soapy water using antibacterial soap. Gently rub the areas to remove crusts. Do not scrub. Preventing the spread  of infection  To help prevent impetigo from spreading to other body areas: Keep your fingernails short and clean. Do not scratch the blisters or sores. Cover infected areas, if necessary, to keep from scratching. Wash your hands often with soap and warm water for at least 20 seconds. To help prevent impetigo from spreading to other people: Do not share towels. Wash your clothing and bedsheets in water that is 140F (60C) or warmer. Stay home until you have used an antibiotic cream for 48 hours (2 days) or an oral antibiotic medicine for 24 hours (1 day). You should only return to work and activities with other people if your skin shows significant improvement. You may return to contact sports after you have used antibiotic medicine for 72 hours (3 days). Contact a health care provider if: You get more blisters or sores, even with treatment. Your skin sores don't get better after 72 hours (3 days) of treatment. You have a fever. The area around your sores becomes warm, red, or tender  to the touch. Get help right away if: You have dark, reddish-brown pee. You do not pee often or you pee small amounts. You have swelling in your face, hands, or feet. This information is not intended to replace advice given to you by your health care provider. Make sure you discuss any questions you have with your health care provider. Document Revised: 06/24/2023 Document Reviewed: 06/24/2023 Elsevier Patient Education  [employer].   If you have been instructed to have an in-person evaluation today at a local Urgent Care facility, please use the link below. It will take you to a list of all of our available [facility], including address, phone number and hours of operation. Please do not delay care.  [facility]  If you or a family member do not have a primary care provider, use the link below to schedule a visit and establish care. When you choose a [facility] primary  care physician or advanced practice provider, you gain a long-term partner in health. Find a Primary Care Provider  Learn more about [facility]'s in-office and virtual care options:  - Get Care Now  "

## 2024-08-08 LAB — CYTOLOGY - PAP
Comment: NEGATIVE
Diagnosis: NEGATIVE
High risk HPV: NEGATIVE

## 2024-08-15 ENCOUNTER — Encounter

## 2024-08-17 ENCOUNTER — Ambulatory Visit: Payer: Self-pay | Admitting: Licensed Clinical Social Worker

## 2024-09-01 ENCOUNTER — Ambulatory Visit: Admitting: Certified Nurse Midwife
